# Patient Record
Sex: Female | Born: 1985 | Race: White | Hispanic: No | Marital: Married | Smoking: Never smoker
Health system: Southern US, Community
[De-identification: ages and names within clinical notes are randomized; demographics above are authoritative.]

## PROBLEM LIST (undated history)

## (undated) DIAGNOSIS — I10 Essential (primary) hypertension: Secondary | ICD-10-CM

## (undated) DIAGNOSIS — E119 Type 2 diabetes mellitus without complications: Secondary | ICD-10-CM

## (undated) DIAGNOSIS — J45909 Unspecified asthma, uncomplicated: Secondary | ICD-10-CM

## (undated) HISTORY — DX: Unspecified asthma, uncomplicated: J45.909

---
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
## 2017-05-26 ENCOUNTER — Encounter: Payer: Self-pay | Admitting: Emergency Medicine

## 2017-05-26 ENCOUNTER — Emergency Department
Admission: EM | Admit: 2017-05-26 | Discharge: 2017-05-26 | Disposition: A | Discharge status: Home / Self Care | Payer: Medicaid - Out of State | Attending: Emergency Medicine | Admitting: Emergency Medicine

## 2017-05-26 ENCOUNTER — Emergency Department: Payer: Medicaid - Out of State

## 2017-05-26 DIAGNOSIS — M79671 Pain in right foot: Secondary | ICD-10-CM | POA: Diagnosis present

## 2017-05-26 NOTE — ED Provider Notes (Signed)
[facility] Emergency Department Provider Note  ____________________________________________  Time seen: Approximately 5:20 PM  I have reviewed the triage vital signs and the nursing notes.   HISTORY  Chief Complaint Foot Pain    HPI Emily Blevins is a 31 y.o. female that presents to the emergency department with right foot pain on and off for years that worsened this week. Pain is primarily over the top of her foot in the middle. She states that she has had pain on and off with this foot for several years. Pain is worse with moving her ankle. Patient does not smoke. No recent surgeries. No injury. No alleviating measures have been attempted. She wears old tennis shoes and sandals to work. 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 She came to the emergency room today for work note. No calf pain, numbness, tingling, erythema.   History reviewed. No pertinent past medical history.  There are no active problems to display for this patient.   History reviewed. No pertinent surgical history.  Prior to Admission medications   Not on File    Allergies Penicillins  No family history on file.  Social History Social History  Substance Use Topics  . Smoking status: Never Smoker  . Smokeless tobacco: Never Used  . Alcohol use No     Review of Systems  Constitutional: No fever/chills Cardiovascular: No chest pain. Respiratory: No SOB. Gastrointestinal: No abdominal pain.  No nausea, no vomiting.  Skin: Negative for rash, abrasions, lacerations, ecchymosis. Neurological: Negative for headaches, numbness or tingling   ____________________________________________   PHYSICAL EXAM:  VITAL SIGNS: ED Triage Vitals [05/26/17 1659]  Enc Vitals Group     BP (!) 142/81     Pulse Rate 78     Resp 18     Temp 97.9 F (36.6 C)     Temp Source Oral     SpO2 99 %     Weight 285 lb (129.3 kg)     Height   (1.727 m)     Head Circumference      Peak Flow      Pain Score 3     Pain Loc      Pain Edu?      Excl. in GC?      Constitutional: Alert and oriented. Well appearing and in no acute distress. Eyes: Conjunctivae are normal. PERRL. EOMI. Head: Atraumatic. ENT:      Ears:      Nose: No congestion/rhinnorhea.      Mouth/Throat: Mucous membranes are moist.  Neck: No stridor.  Cardiovascular: Normal rate, regular rhythm.  Good peripheral circulation. Respiratory: Normal respiratory effort without tachypnea or retractions. Lungs CTAB. Good air entry to the bases with no decreased or absent breath sounds. Musculoskeletal: Full range of motion to all extremities. No gross deformities appreciated. No calf tenderness to palpation. Tenderness to palpation over dorsal side of foot over the second and third metatarsal. Minimal swelling over second and third metatarsal on right foot. Minimal tenderness to palpation over lateral side of right ankle. No erythema.  Neurologic:  Normal speech and language. No gross focal neurologic deficits are appreciated.  Skin:  Skin is warm, dry and intact. No rash noted.   ____________________________________________   LABS (all labs ordered are listed, but only abnormal results are displayed)  Labs Reviewed - No data to display ____________________________________________  EKG   ____________________________________________  RADIOLOGY Karsten Ro  Loreta AveWagner, personally viewed and evaluated these images (plain radiographs) as part of my medical decision making, as well as reviewing the written report by the radiologist.  Dg Foot Complete Right  Result Date: 05/26/2017 CLINICAL DATA:  Right foot pain and swelling.  No known injury. EXAM: RIGHT FOOT COMPLETE - 3+ VIEW COMPARISON:  None. FINDINGS: There is no evidence of fracture or dislocation. Plantar calcaneal spur. There is no evidence of arthropathy or other focal bone abnormality. Mild soft tissue edema  versus habitus. IMPRESSION: Plantar calcaneal spur.  No acute osseous abnormality. Soft tissue edema versus habitus. Electronically Signed   By: Rubye OaksMelanie  Ehinger M.D.   On: 05/26/2017 18:44    ____________________________________________    PROCEDURES  Procedure(s) performed:    Procedures    Medications - No data to display   ____________________________________________   INITIAL IMPRESSION / ASSESSMENT AND PLAN / ED COURSE  Pertinent labs & imaging results that were available during my care of the patient were reviewed by me and considered in my medical decision making (see chart for details).  Review of the [place] CSRS was performed in accordance of the N[facility] prior to dispensing any controlled drugs.   Patient presented to the emergency department with evaluation of foot pain. X-ray negative for acute bony abnormality. Pain has been on and off for 1 year and has worsened in the last week. We discussed wearing supportive shoes. Patient is to follow up with podiatry as directed. Patient is given ED precautions to return to the ED for any worsening or new symptoms.     ____________________________________________  FINAL CLINICAL IMPRESSION(S) / ED DIAGNOSES  Final diagnoses:  Foot pain, right      NEW MEDICATIONS STARTED DURING THIS VISIT:  There are no discharge medications for this patient.       This chart was dictated using voice recognition software/Dragon. Despite best efforts to proofread, errors can occur which can change the meaning. Any change was purely unintentional.    Enid DerryWagner, Humza Tallerico, PA-C 05/27/17 1627    Emily FilbertWilliams, Jonathan E, MD 05/29/17 [phone]

## 2017-05-26 NOTE — ED Notes (Signed)
Pt verbalizes understanding of discharge instructions.

## 2017-05-26 NOTE — ED Triage Notes (Signed)
Presents with pain across the top of right foot  Denies any injury min swelling noted

## 2017-07-17 ENCOUNTER — Emergency Department
Admission: EM | Admit: 2017-07-17 | Discharge: 2017-07-17 | Disposition: A | Discharge status: Home / Self Care | Payer: Medicaid - Out of State | Attending: Emergency Medicine | Admitting: Emergency Medicine

## 2017-07-17 DIAGNOSIS — H1033 Unspecified acute conjunctivitis, bilateral: Secondary | ICD-10-CM

## 2017-07-17 MED ORDER — CIPROFLOXACIN HCL 0.3 % OP SOLN
2.0000 [drp] | Freq: Once | OPHTHALMIC | Status: AC
  Administered 2017-07-17: 2 [drp] via OPHTHALMIC
  Filled 2017-07-17: qty 2.5

## 2017-07-17 MED ORDER — CIPROFLOXACIN HCL 0.3 % OP SOLN: 1.0000 [drp] | OPHTHALMIC | 1 refills | Status: AC

## 2017-07-17 NOTE — ED Triage Notes (Signed)
Reports whole family with eye redness and swelling. And she has the same.

## 2017-07-17 NOTE — ED Provider Notes (Signed)
[facility] Emergency Department Provider Note  ____________________________________________  Time seen: Approximately 9:18 PM  I have reviewed the triage vital signs and the nursing notes.   HISTORY  Chief Complaint Eye Problem    HPI Emily Blevins is a 31 y.o. female who presents emergency department complaining of erythema, irritation, or drainage from bilateral eyes.  The patient reports that her son was diagnosed with pinkeye, and subsequently the entire family has developed same.  Patient reports that symptoms started in her left eye and now encompasses both eyes.  No visual changes.  No trauma.  Patient wears glasses but not contacts.  No past medical history on file.  There are no active problems to display for this patient.   No past surgical history on file.  Prior to Admission medications   Medication Sig Start Date End Date Taking? Authorizing Provider  ciprofloxacin (CILOXAN) 0.3 % ophthalmic solution Place 1 drop into both eyes every 2 (two) hours. Administer 1 drop, every 2 hours, while awake, for 2 days. Then 1 drop, every 4 hours, while awake, for the next 5 days. 07/17/17 07/22/17  Cuthriell, Delorise RoyalsJonathan D, PA-C    Allergies Penicillins  No family history on file.  Social History Social History  Substance Use Topics  . Smoking status: Never Smoker  . Smokeless tobacco: Never Used  . Alcohol use No     Review of Systems  Constitutional: No fever/chills Eyes: No visual changes.  Positive for pain, erythema, pustular drainage from both eyes. ENT: No upper respiratory complaints. Cardiovascular: no chest pain. Respiratory: no cough. No SOB. Gastrointestinal: No abdominal pain.  No nausea, no vomiting.  Musculoskeletal: Negative for musculoskeletal pain. Skin: Negative for rash, abrasions, lacerations, ecchymosis. Neurological: Negative for headaches, focal weakness or numbness. 10-point ROS otherwise  negative.  ____________________________________________   PHYSICAL EXAM:  VITAL SIGNS: ED Triage Vitals  Enc Vitals Group     BP 07/17/17 1933 (!) 144/79     Pulse Rate 07/17/17 1933 73     Resp 07/17/17 1933 20     Temp 07/17/17 1933 98.4 F (36.9 C)     Temp Source 07/17/17 1933 Oral     SpO2 07/17/17 1933 96 %     Weight 07/17/17 1934 270 lb (122.5 kg)     Height 07/17/17 1934 5\' 8"  (1.727 m)     Head Circumference --      Peak Flow --      Pain Score --      Pain Loc --      Pain Edu? --      Excl. in GC? --      Constitutional: Alert and oriented. Well appearing and in no acute distress. Eyes: Conjunctivae are erythematous bilaterally.  Mild pustular drainage bilateral lower eyelashes.  Funduscopic exam is unremarkable bilaterally.Marland Kitchen. PERRL. EOMI. Head: Atraumatic. ENT:      Ears:       Nose: No congestion/rhinnorhea.      Mouth/Throat: Mucous membranes are moist.  Neck: No stridor.    Cardiovascular: Normal rate, regular rhythm. Normal S1 and S2.  Good peripheral circulation. Respiratory: Normal respiratory effort without tachypnea or retractions. Lungs CTAB. Good air entry to the bases with no decreased or absent breath sounds. Musculoskeletal: Full range of motion to all extremities. No gross deformities appreciated. Neurologic:  Normal speech and language. No gross focal neurologic deficits are appreciated.  Skin:  Skin is warm, dry and intact. No rash noted. Psychiatric: Mood and affect are  normal. Speech and behavior are normal. Patient exhibits appropriate insight and judgement.   ____________________________________________   LABS (all labs ordered are listed, but only abnormal results are displayed)  Labs Reviewed - No data to display ____________________________________________  EKG   ____________________________________________  RADIOLOGY   No results found.  ____________________________________________    PROCEDURES  Procedure(s)  performed:    Procedures    Medications  ciprofloxacin (CILOXAN) 0.3 % ophthalmic solution 2 drop (not administered)     ____________________________________________   INITIAL IMPRESSION / ASSESSMENT AND PLAN / ED COURSE  Pertinent labs & imaging results that were available during my care of the patient were reviewed by me and considered in my medical decision making (see chart for details).  Review of the [place] CSRS was performed in accordance of the N[facility] prior to dispensing any controlled drugs.     Patient's diagnosis is consistent with bacterial conjunctivitis.  Patient has been exposed to her son who had bacterial conjunctivitis, now the entire family has symptoms.. Patient will be discharged home with prescriptions for Cipro eyedrops. Patient is to follow up with ophthalmologist as needed or otherwise directed. Patient is given ED precautions to return to the ED for any worsening or new symptoms.     ____________________________________________  FINAL CLINICAL IMPRESSION(S) / ED DIAGNOSES  Final diagnoses:  Acute bacterial conjunctivitis of both eyes      NEW MEDICATIONS STARTED DURING THIS VISIT:  New Prescriptions   CIPROFLOXACIN (CILOXAN) 0.3 % OPHTHALMIC SOLUTION    Place 1 drop into both eyes every 2 (two) hours. Administer 1 drop, every 2 hours, while awake, for 2 days. Then 1 drop, every 4 hours, while awake, for the next 5 days.        This chart was dictated using voice recognition software/Dragon. Despite best efforts to proofread, errors can occur which can change the meaning. Any change was purely unintentional.    Racheal Patches, PA-C 07/17/17 2125    Sharman Cheek, MD 07/17/17 2342

## 2017-11-04 ENCOUNTER — Emergency Department
Admission: EM | Admit: 2017-11-04 | Discharge: 2017-11-04 | Disposition: A | Discharge status: Home / Self Care | Payer: Self-pay | Attending: Student in an Organized Health Care Education/Training Program | Admitting: Student in an Organized Health Care Education/Training Program

## 2017-11-04 ENCOUNTER — Other Ambulatory Visit: Payer: Self-pay

## 2017-11-04 ENCOUNTER — Emergency Department: Payer: Self-pay

## 2017-11-04 ENCOUNTER — Encounter: Payer: Self-pay | Admitting: Emergency Medicine

## 2017-11-04 DIAGNOSIS — M654 Radial styloid tenosynovitis [de Quervain]: Secondary | ICD-10-CM | POA: Insufficient documentation

## 2017-11-04 MED ORDER — MELOXICAM 15 MG PO TABS: 15.0000 mg | ORAL_TABLET | Freq: Every day | ORAL | 1 refills | Status: AC

## 2017-11-04 NOTE — ED Provider Notes (Signed)
[facility] Emergency Department Provider Note  ____________________________________________  Time seen: Approximately 4:52 PM  I have reviewed the triage vital signs and the nursing notes.   HISTORY  Chief Complaint Wrist Pain    HPI Emily Blevins is a 32 y.o. female presents to the emergency department with pain along the first dorsal extensor compartment of the left wrist worsened with ulnar deviation at the left wrist.  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  She denies weakness but has tingling in the pads of all 5 fingers.  No alleviating measures have been attempted.  She currently rates her pain 8 out of 10 in intensity.   History reviewed. No pertinent past medical history.  There are no active problems to display for this patient.   History reviewed. No pertinent surgical history.  Prior to Admission medications   Medication Sig Start Date End Date Taking? Authorizing Provider  meloxicam (MOBIC) 15 MG tablet Take 1 tablet (15 mg total) by mouth daily for 7 days. 11/04/17 11/11/17  Orvil FeilWoods, Elzina Devera M, PA-C    Allergies Penicillins  No family history on file.  Social History Social History   Tobacco Use  . Smoking status: Never Smoker  . Smokeless tobacco: Never Used  Substance Use Topics  . Alcohol use: No  . Drug use: Not on file     Review of Systems  Constitutional: No fever/chills Eyes: No visual changes. No discharge ENT: No upper respiratory complaints. Cardiovascular: no chest pain. Respiratory: no cough. No SOB. Gastrointestinal: No abdominal pain.  No nausea, no vomiting.  No diarrhea.  No constipation. Genitourinary: Negative for dysuria. No hematuria Musculoskeletal: Patient has left wrist pain.  Skin: Negative for rash, abrasions, lacerations, ecchymosis. Neurological: Negative for headaches, focal weakness or  numbness.   ____________________________________________   PHYSICAL EXAM:  VITAL SIGNS: ED Triage Vitals  Enc Vitals Group     BP 11/04/17 1433 136/77     Pulse Rate 11/04/17 1433 75     Resp 11/04/17 1433 20     Temp 11/04/17 1433 98.1 F (36.7 C)     Temp Source 11/04/17 1433 Oral     SpO2 11/04/17 1433 98 %     Weight 11/04/17 1434 300 lb (136.1 kg)     Height 11/04/17 1434 5\' 8"  (1.727 m)     Head Circumference --      Peak Flow --      Pain Score 11/04/17 1434 8     Pain Loc --      Pain Edu? --      Excl. in GC? --      Constitutional: Alert and oriented. Well appearing and in no acute distress. Eyes: Conjunctivae are normal. PERRL. EOMI. Head: Atraumatic. Cardiovascular: Normal rate, regular rhythm. Normal S1 and S2.  Good peripheral circulation. Respiratory: Normal respiratory effort without tachypnea or retractions. Lungs CTAB. Good air entry to the bases with no decreased or absent breath sounds. Musculoskeletal: Patient can perform limited range of motion at the left wrist, likely secondary to pain.  Patient localizes pain to the first dorsal extensor compartment of the left wrist.  Patient's pain is reproduced with Lourena SimmondsFinkelstein test.  Palpable radial pulse, left. Neurologic:  Normal speech and language. No gross focal neurologic deficits are appreciated.  Skin:  Skin is warm, dry and intact. No rash noted. Psychiatric: Mood and affect are normal. Speech and behavior are normal. Patient exhibits appropriate insight and judgement.  ____________________________________________   LABS (all labs ordered are listed, but only abnormal results are displayed)  Labs Reviewed - No data to display ____________________________________________  EKG   ____________________________________________  RADIOLOGY Geraldo Pitter, personally viewed and evaluated these images (plain radiographs) as part of my medical decision making, as well as reviewing the written report  by the radiologist.  Dg Wrist Complete Left  Result Date: 11/04/2017 CLINICAL DATA:  Wrist pain after injury. EXAM: LEFT WRIST - COMPLETE 3+ VIEW COMPARISON:  None. FINDINGS: There is no evidence of fracture or dislocation. There is no evidence of arthropathy or other focal bone abnormality. Soft tissues are unremarkable. IMPRESSION: Negative. Electronically Signed   By: Kennith Center M.D.   On: 11/04/2017 16:14    ____________________________________________    PROCEDURES  Procedure(s) performed:    Procedures    Medications - No data to display   ____________________________________________   INITIAL IMPRESSION / ASSESSMENT AND PLAN / ED COURSE  Pertinent labs & imaging results that were available during my care of the patient were reviewed by me and considered in my medical decision making (see chart for details).  Review of the [place] CSRS was performed in accordance of the N[facility] prior to dispensing any controlled drugs.     Assessment and plan De Quervain's tenosynovitis Patient presents to the emergency department with left wrist pain localized to the first dorsal extensor compartment and reproduced with Lourena Simmonds test.  Differential diagnosis originally included ligamentous sprain, carpal tunnel and fracture.  X-ray examination of the left wrist was reassuring.  Patient was treated empirically for de Quervain's with a wrist splint and meloxicam.  Patient was advised to follow-up with orthopedics if pain persists.  All patient questions were answered.     ____________________________________________  FINAL CLINICAL IMPRESSION(S) / ED DIAGNOSES  Final diagnoses:  De Quervain's tenosynovitis      NEW MEDICATIONS STARTED DURING THIS VISIT:  ED Discharge Orders        Ordered    meloxicam (MOBIC) 15 MG tablet  Daily     11/04/17 1639          This chart was dictated using voice recognition software/Dragon. Despite best efforts to proofread, errors can  occur which can change the meaning. Any change was purely unintentional.    Orvil Feil, PA-C 11/04/17 1656    Willy Eddy, MD 11/04/17 [phone]

## 2017-11-04 NOTE — ED Notes (Signed)

## 2017-11-04 NOTE — ED Triage Notes (Signed)
L wrist pain since yesterday. States was doing a lot of pulling at work.

## 2017-11-04 NOTE — ED Notes (Addendum)
Pt states she is having numbness and tingling from finger tips up past wrist. Pt states " it feels like ants crawling around in there" pain increases when pressure applied to any part of the hand. Cap refill < 3 sec and circulation/pulse are WNL

## 2018-04-25 ENCOUNTER — Emergency Department
Admission: EM | Admit: 2018-04-25 | Discharge: 2018-04-25 | Disposition: A | Discharge status: Home / Self Care | Payer: Self-pay | Attending: Emergency Medicine | Admitting: Emergency Medicine

## 2018-04-25 ENCOUNTER — Other Ambulatory Visit: Payer: Self-pay

## 2018-04-25 ENCOUNTER — Encounter: Payer: Self-pay | Admitting: Emergency Medicine

## 2018-04-25 DIAGNOSIS — J029 Acute pharyngitis, unspecified: Secondary | ICD-10-CM

## 2018-04-25 LAB — GROUP A STREP BY PCR: Group A Strep by PCR: NOT DETECTED

## 2018-04-25 MED ORDER — CETIRIZINE HCL 10 MG PO TABS: 10.0000 mg | ORAL_TABLET | Freq: Every day | ORAL | 0 refills | Status: DC

## 2018-04-25 MED ORDER — DEXAMETHASONE SODIUM PHOSPHATE 10 MG/ML IJ SOLN
10.0000 mg | Freq: Once | INTRAMUSCULAR | Status: AC
  Administered 2018-04-25: 10 mg via INTRAMUSCULAR
  Filled 2018-04-25: qty 1

## 2018-04-25 NOTE — ED Triage Notes (Signed)
Patient ambulatory to triage with steady gait, without difficulty or distress noted; pt reports recent scratchy throat, now painful; unsure if allergies or illness

## 2018-04-25 NOTE — ED Notes (Signed)
Pt complains of sinus congestion and drainage over the last couple weeks with throat pain increasing today. Pt complains of difficulty and pain swallowing.

## 2018-04-25 NOTE — ED Provider Notes (Signed)
[facility] Emergency Department Provider Note  ____________________________________________  Time seen: Approximately 9:26 PM  I have reviewed the triage vital signs and the nursing notes.   HISTORY  Chief Complaint Sore Throat    HPI Emily Blevins is a 32 y.o. female presents to the emergency department with rhinorrhea, congestion, nonproductive cough and bilateral conjunctivitis for approximately 5 days.  Patient reports that pharyngitis progressively worsened today and she reports pain with eating.  Patient is managing her own secretions and tolerating fluids.  She denies fever or chills.  Patient has tried Tylenol but no other alleviating measures.   History reviewed. No pertinent past medical history.  There are no active problems to display for this patient.   History reviewed. No pertinent surgical history.  Prior to Admission medications   Medication Sig Start Date End Date Taking? Authorizing Provider  cetirizine (ZYRTEC ALLERGY) 10 MG tablet Take 1 tablet (10 mg total) by mouth daily for 7 days. 04/25/18 05/02/18  Orvil FeilWoods, Jaclyn M, PA-C    Allergies Penicillins  No family history on file.  Social History Social History   Tobacco Use  . Smoking status: Never Smoker  . Smokeless tobacco: Never Used  Substance Use Topics  . Alcohol use: No  . Drug use: Not on file      Review of Systems  Constitutional: Patient has been afebrile.  Eyes: No visual changes. No discharge ENT: Patient has congestion.  Cardiovascular: no chest pain. Respiratory: Patient has cough.  Gastrointestinal: No abdominal pain.  No nausea, no vomiting. Patient had diarrhea.  Genitourinary: Negative for dysuria. No hematuria Musculoskeletal: Patient has myalgias.  Skin: Negative for rash, abrasions, lacerations, ecchymosis. Neurological: No headache, no focal weakness or numbness.     ____________________________________________   PHYSICAL  EXAM:  VITAL SIGNS: ED Triage Vitals  Enc Vitals Group     BP 04/25/18 2005 (!) 144/90     Pulse Rate 04/25/18 2005 92     Resp 04/25/18 2005 18     Temp 04/25/18 2005 98.3 F (36.8 C)     Temp Source 04/25/18 2005 Oral     SpO2 04/25/18 2005 100 %     Weight 04/25/18 1958 274 lb (124.3 kg)     Height 04/25/18 1958 5\' 8"  (1.727 m)     Head Circumference --      Peak Flow --      Pain Score 04/25/18 1959 10     Pain Loc --      Pain Edu? --      Excl. in GC? --      Constitutional: Alert and oriented. Well appearing and in no acute distress. Eyes: Conjunctivae are normal. PERRL. EOMI. Head: Atraumatic. ENT:      Ears: TMs are effused bilaterally.      Nose: No congestion/rhinnorhea.      Mouth/Throat: Mucous membranes are moist.  Posterior pharynx is mildly erythematous with no tonsillar exudate.  Tonsils appear symmetric and uvula is midline. Neck: No stridor.  No cervical spine tenderness to palpation. Cardiovascular: Normal rate, regular rhythm. Normal S1 and S2.  Good peripheral circulation. Respiratory: Normal respiratory effort without tachypnea or retractions. Lungs CTAB. Good air entry to the bases with no decreased or absent breath sounds. Gastrointestinal: Bowel sounds 4 quadrants. Soft and nontender to palpation. No guarding or rigidity. No palpable masses. No distention. No CVA tenderness. Musculoskeletal: Full range of motion to all extremities. No gross deformities appreciated. Neurologic:  Normal speech and language.  No gross focal neurologic deficits are appreciated.  Skin:  Skin is warm, dry and intact. No rash noted.   ____________________________________________   LABS (all labs ordered are listed, but only abnormal results are displayed)  Labs Reviewed  GROUP A STREP BY PCR   ____________________________________________  EKG   ____________________________________________  RADIOLOGY   No results  found.  ____________________________________________    PROCEDURES  Procedure(s) performed:    Procedures    Medications  dexamethasone (DECADRON) injection 10 mg (has no administration in time range)     ____________________________________________   INITIAL IMPRESSION / ASSESSMENT AND PLAN / ED COURSE  Pertinent labs & imaging results that were available during my care of the patient were reviewed by me and considered in my medical decision making (see chart for details).  Review of the [place] CSRS was performed in accordance of the N[facility] prior to dispensing any controlled drugs.      Assessment and plan Viral pharyngitis Patient presents to the emergency department with pharyngitis.  Associated symptoms include congestion, rhinorrhea and bilateral conjunctivitis.  History and physical exam findings are consistent with viral  pharyngitis.  Patient was given Decadron in the emergency department.  She was discharged with Zyrtec.  Vital signs are reassuring prior to discharge.  All patient questions were answered.   ____________________________________________  FINAL CLINICAL IMPRESSION(S) / ED DIAGNOSES  Final diagnoses:  Viral pharyngitis      NEW MEDICATIONS STARTED DURING THIS VISIT:  ED Discharge Orders        Ordered    cetirizine (ZYRTEC ALLERGY) 10 MG tablet  Daily     04/25/18 2115          This chart was dictated using voice recognition software/Dragon. Despite best efforts to proofread, errors can occur which can change the meaning. Any change was purely unintentional.    Orvil Feil, PA-C 04/25/18 2134    Minna Antis, MD 04/25/18 2251

## 2018-10-22 ENCOUNTER — Encounter: Payer: Self-pay | Admitting: Emergency Medicine

## 2018-10-22 ENCOUNTER — Emergency Department
Admission: EM | Admit: 2018-10-22 | Discharge: 2018-10-22 | Disposition: A | Discharge status: Home / Self Care | Payer: 59 | Attending: Emergency Medicine | Admitting: Emergency Medicine

## 2018-10-22 DIAGNOSIS — K529 Noninfective gastroenteritis and colitis, unspecified: Secondary | ICD-10-CM | POA: Diagnosis not present

## 2018-10-22 DIAGNOSIS — R112 Nausea with vomiting, unspecified: Secondary | ICD-10-CM | POA: Diagnosis present

## 2018-10-22 LAB — URINALYSIS, COMPLETE (UACMP) WITH MICROSCOPIC
BILIRUBIN URINE: NEGATIVE
Bacteria, UA: NONE SEEN
GLUCOSE, UA: NEGATIVE mg/dL
HGB URINE DIPSTICK: NEGATIVE
Ketones, ur: NEGATIVE mg/dL
LEUKOCYTES UA: NEGATIVE
NITRITE: NEGATIVE
PROTEIN: NEGATIVE mg/dL
Specific Gravity, Urine: 1.026 (ref 1.005–1.030)
pH: 6 (ref 5.0–8.0)

## 2018-10-22 LAB — CBC
HCT: 41.5 % (ref 36.0–46.0)
HEMOGLOBIN: 13.7 g/dL (ref 12.0–15.0)
MCH: 26.7 pg (ref 26.0–34.0)
MCHC: 33 g/dL (ref 30.0–36.0)
MCV: 80.7 fL (ref 80.0–100.0)
NRBC: 0 % (ref 0.0–0.2)
Platelets: 242 10*3/uL (ref 150–400)
RBC: 5.14 MIL/uL — ABNORMAL HIGH (ref 3.87–5.11)
RDW: 14.6 % (ref 11.5–15.5)
WBC: 13.8 10*3/uL — AB (ref 4.0–10.5)

## 2018-10-22 LAB — COMPREHENSIVE METABOLIC PANEL
ALT: 26 U/L (ref 0–44)
AST: 20 U/L (ref 15–41)
Albumin: 4.1 g/dL (ref 3.5–5.0)
Alkaline Phosphatase: 47 U/L (ref 38–126)
Anion gap: 8 (ref 5–15)
BILIRUBIN TOTAL: 0.9 mg/dL (ref 0.3–1.2)
BUN: 17 mg/dL (ref 6–20)
CO2: 23 mmol/L (ref 22–32)
Calcium: 8.9 mg/dL (ref 8.9–10.3)
Chloride: 106 mmol/L (ref 98–111)
Creatinine, Ser: 0.68 mg/dL (ref 0.44–1.00)
Glucose, Bld: 107 mg/dL — ABNORMAL HIGH (ref 70–99)
POTASSIUM: 3.6 mmol/L (ref 3.5–5.1)
Sodium: 137 mmol/L (ref 135–145)
TOTAL PROTEIN: 7.2 g/dL (ref 6.5–8.1)

## 2018-10-22 LAB — POCT PREGNANCY, URINE: Preg Test, Ur: NEGATIVE

## 2018-10-22 LAB — LIPASE, BLOOD: LIPASE: 24 U/L (ref 11–51)

## 2018-10-22 MED ORDER — SODIUM CHLORIDE 0.9% FLUSH: 3.0000 mL | Freq: Once | INTRAVENOUS | Status: DC

## 2018-10-22 NOTE — Discharge Instructions (Signed)
Please seek medical attention for any high fevers, chest pain, shortness of breath, change in behavior, persistent vomiting, bloody stool or any other new or concerning symptoms.  

## 2018-10-22 NOTE — ED Triage Notes (Signed)
Pt c/o N/V/D since this AM but has since been able to keep down chicken broth but is unable to return to work without doctors note. Pt has 2 children at home that had same symptoms over the weekend.

## 2018-10-22 NOTE — ED Provider Notes (Signed)
[facility] Emergency Department Provider Note   ____________________________________________   I have reviewed the triage vital signs and the nursing notes.   HISTORY  Chief Complaint I am here for a work note  History limited by: Not Limited   HPI Emily Blevins is a 33 y.o. female who presents to the emergency department today to obtain a work note.  She states she was told by her job she needed work-up before she could go back to work.  The patient called out from work today because of concerns for nausea vomiting diarrhea.  The symptoms did start today.  The patient states that her last episode of vomiting was roughly 4 PM today.  She did have family member with similar symptoms.  History reviewed. No pertinent past medical history.  There are no active problems to display for this patient.   History reviewed. No pertinent surgical history.  Prior to Admission medications   Medication Sig Start Date End Date Taking? Authorizing Provider  cetirizine (ZYRTEC ALLERGY) 10 MG tablet Take 1 tablet (10 mg total) by mouth daily for 7 days. 04/25/18 05/02/18  Orvil FeilWoods, Jaclyn M, PA-C    Allergies Penicillins  History reviewed. No pertinent family history.  Social History Social History   Tobacco Use  . Smoking status: Never Smoker  . Smokeless tobacco: Never Used  Substance Use Topics  . Alcohol use: No  . Drug use: Not on file    Review of Systems Constitutional: No fever/chills Eyes: No visual changes. ENT: No sore throat. Cardiovascular: Denies chest pain. Respiratory: Denies shortness of breath. Gastrointestinal: Positive for abdominal pain, nausea vomiting and diarrhea Genitourinary: Negative for dysuria. Musculoskeletal: Negative for back pain. Skin: Negative for rash. Neurological: Negative for headaches, focal weakness or numbness.  ____________________________________________   PHYSICAL EXAM:  VITAL SIGNS: ED Triage Vitals  [10/22/18 2019]  Enc Vitals Group     BP 133/85     Pulse Rate 80     Resp 18     Temp 98.3 F (36.8 C)     Temp Source Oral     SpO2 99 %     Weight 290 lb (131.5 kg)     Height 5\' 8"  (1.727 m)   Constitutional: Alert and oriented.  Eyes: Conjunctivae are normal.  ENT      Head: Normocephalic and atraumatic.      Nose: No congestion/rhinnorhea.      Mouth/Throat: Mucous membranes are moist.      Neck: No stridor. Hematological/Lymphatic/Immunilogical: No cervical lymphadenopathy. Cardiovascular: Normal rate, regular rhythm.  No murmurs, rubs, or gallops.  Respiratory: Normal respiratory effort without tachypnea nor retractions. Breath sounds are clear and equal bilaterally. No wheezes/rales/rhonchi. Gastrointestinal: Soft and non tender. No rebound. No guarding.  Genitourinary: Deferred Musculoskeletal: Normal range of motion in all extremities. No lower extremity edema. Neurologic:  Normal speech and language. No gross focal neurologic deficits are appreciated.  Skin:  Skin is warm, dry and intact. No rash noted. Psychiatric: Mood and affect are normal. Speech and behavior are normal. Patient exhibits appropriate insight and judgment.  ____________________________________________    LABS (pertinent positives/negatives)  Upreg negative Lipase 24 CMP wnl except glu 107 CBC wbc 13.8, hgb 13.7, plt 242 UA clear, unremarkable  ____________________________________________   EKG  None  ____________________________________________    RADIOLOGY  None  ____________________________________________   PROCEDURES  Procedures  ____________________________________________   INITIAL IMPRESSION / ASSESSMENT AND PLAN / ED COURSE  Pertinent labs & imaging results that  were available during my care of the patient were reviewed by me and considered in my medical decision making (see chart for details).   Presented to the emergency department today to obtain a work  note.  She called out from work today because of concerns for nausea vomiting diarrhea.  The patient will be given a work note. ____________________________________________   FINAL CLINICAL IMPRESSION(S) / ED DIAGNOSES  Final diagnoses:  Gastroenteritis     Note: This dictation was prepared with Dragon dictation. Any transcriptional errors that result from this process are unintentional     Phineas Semen, MD 10/22/18 2330

## 2019-11-28 IMAGING — DX DG WRIST COMPLETE 3+V*L*
4 series · 4 of 4 positions shown · non-contrast
Comparison: None.

CLINICAL DATA: Wrist pain after injury.

EXAM:
LEFT WRIST - COMPLETE 3+ VIEW

[wrist ap (1 of 2)]
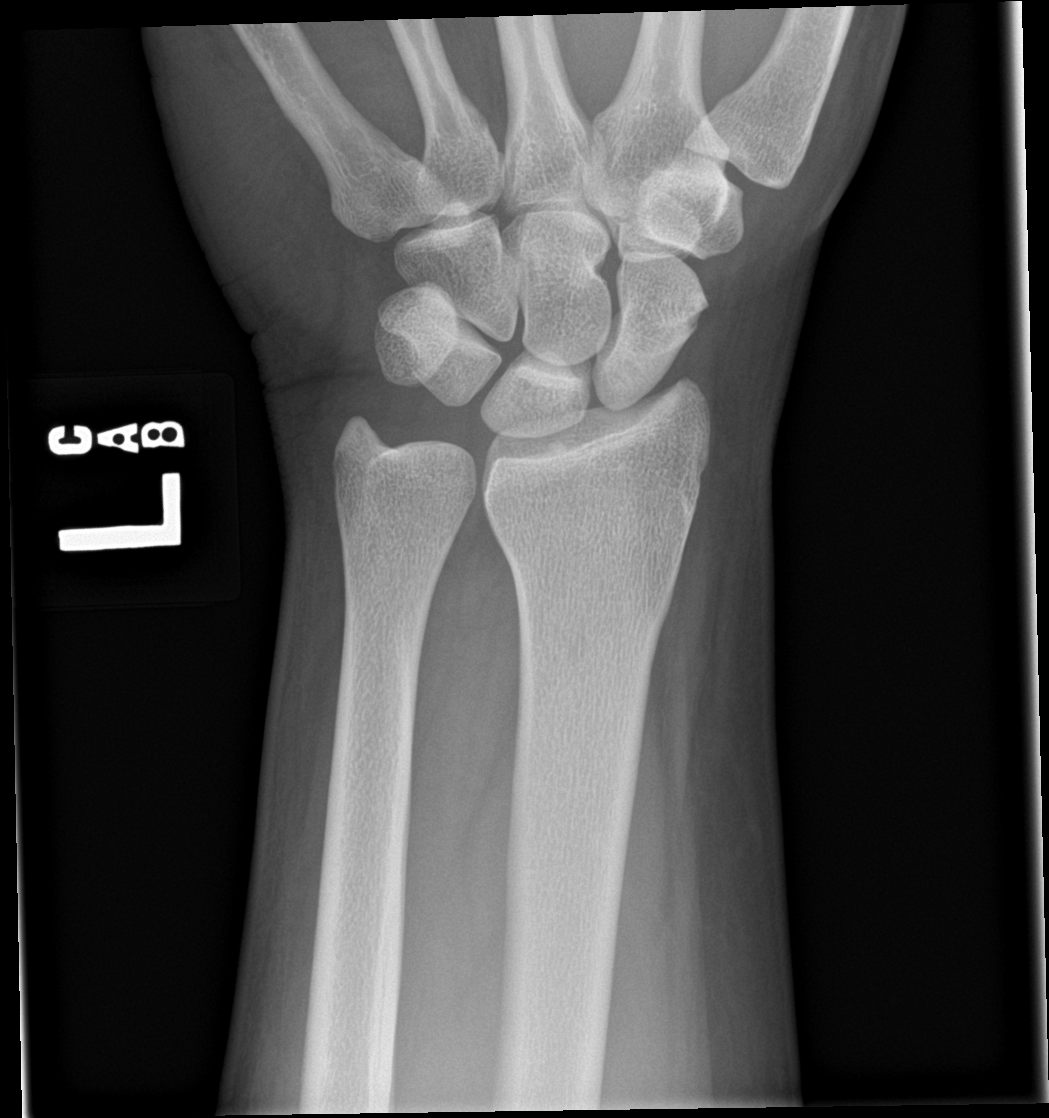

[wrist obl]
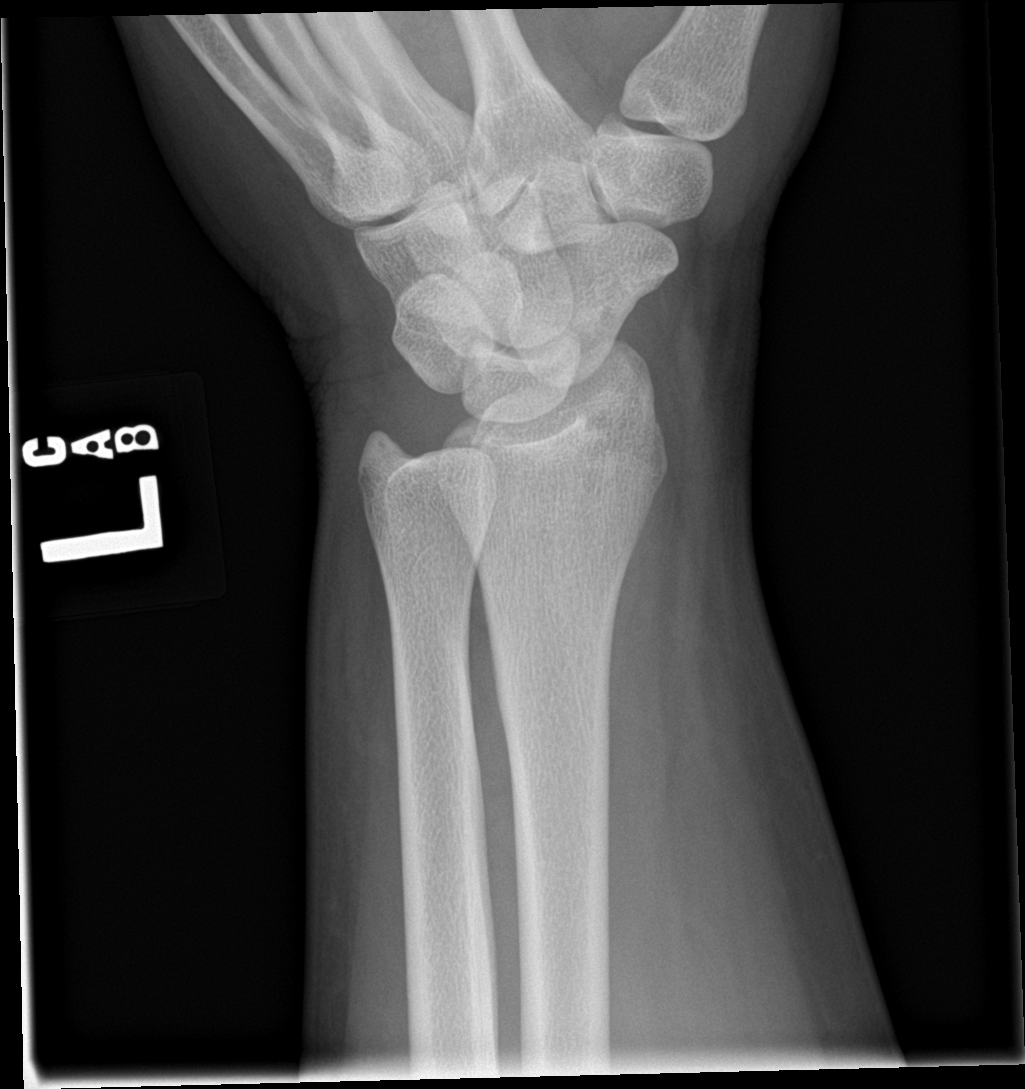

[wrist lat]
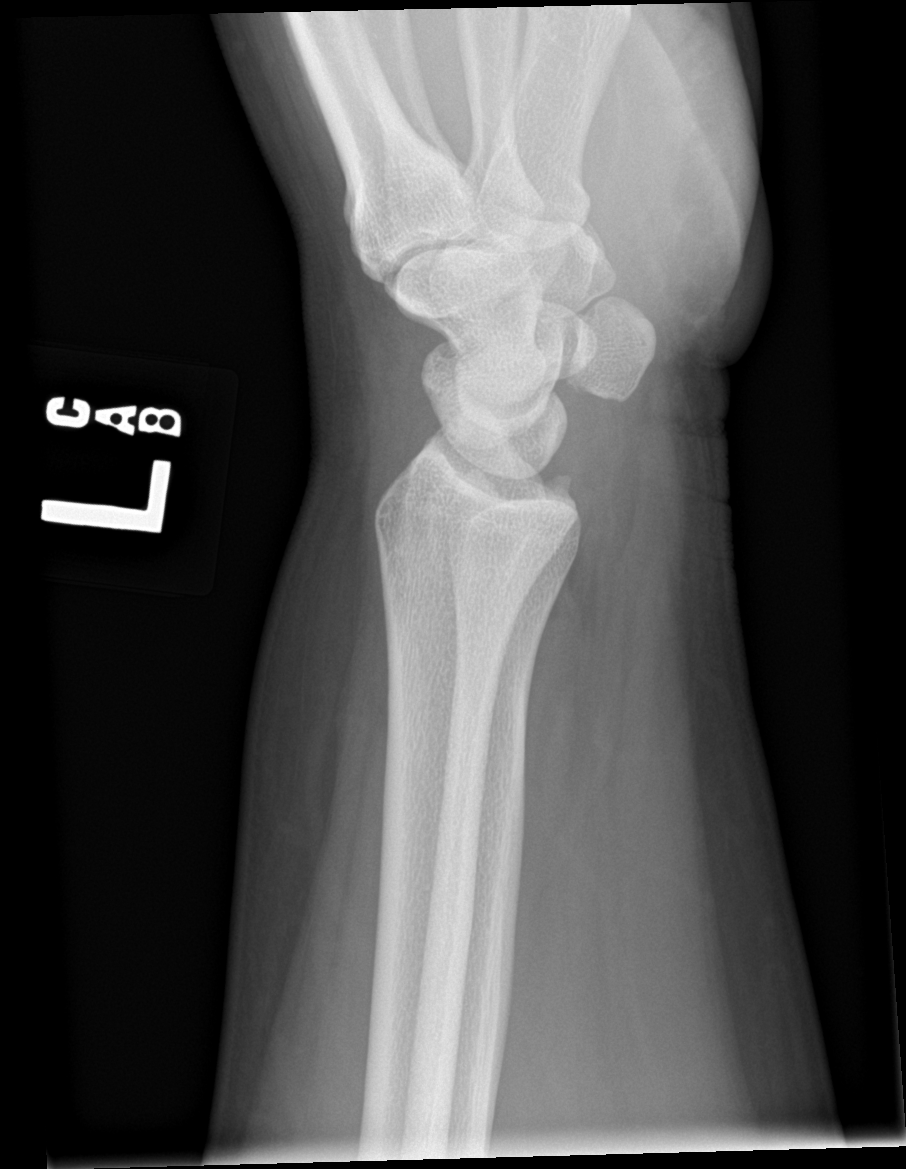

[wrist ap (2 of 2)]
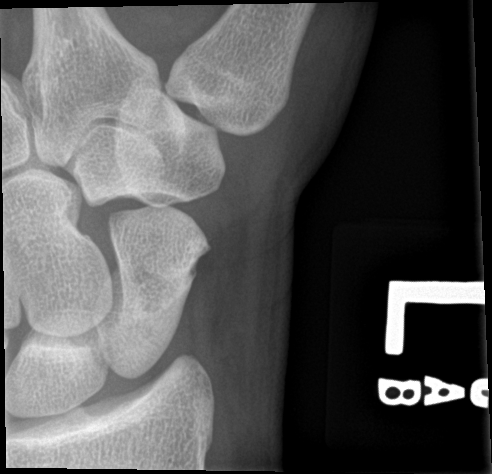

[4 of 4 positions shown; findings below may reference images not displayed]

FINDINGS: There is no evidence of fracture or dislocation. There is no
evidence of arthropathy or other focal bone abnormality. Soft
tissues are unremarkable.
IMPRESSION: Negative.

## 2020-08-31 ENCOUNTER — Emergency Department
Admission: EM | Admit: 2020-08-31 | Discharge: 2020-08-31 | Disposition: A | Discharge status: Home / Self Care | Payer: 59 | Attending: Emergency Medicine | Admitting: Emergency Medicine

## 2020-08-31 ENCOUNTER — Other Ambulatory Visit: Payer: Self-pay

## 2020-08-31 ENCOUNTER — Ambulatory Visit: Payer: Self-pay | Admitting: *Deleted

## 2020-08-31 DIAGNOSIS — I10 Essential (primary) hypertension: Secondary | ICD-10-CM | POA: Insufficient documentation

## 2020-08-31 DIAGNOSIS — E1165 Type 2 diabetes mellitus with hyperglycemia: Secondary | ICD-10-CM | POA: Insufficient documentation

## 2020-08-31 DIAGNOSIS — E860 Dehydration: Secondary | ICD-10-CM | POA: Insufficient documentation

## 2020-08-31 DIAGNOSIS — R739 Hyperglycemia, unspecified: Secondary | ICD-10-CM

## 2020-08-31 HISTORY — DX: Type 2 diabetes mellitus without complications: E11.9

## 2020-08-31 HISTORY — DX: Essential (primary) hypertension: I10

## 2020-08-31 LAB — BASIC METABOLIC PANEL
Anion gap: 11 (ref 5–15)
BUN: 15 mg/dL (ref 6–20)
CO2: 23 mmol/L (ref 22–32)
Calcium: 9.6 mg/dL (ref 8.9–10.3)
Chloride: 100 mmol/L (ref 98–111)
Creatinine, Ser: 0.86 mg/dL (ref 0.44–1.00)
GFR, Estimated: >60 mL/min (ref >60)
Glucose, Bld: 348 mg/dL — ABNORMAL HIGH (ref 70–99)
Potassium: 4.2 mmol/L (ref 3.5–5.1)
Sodium: 134 mmol/L — ABNORMAL LOW (ref 135–145)

## 2020-08-31 LAB — URINALYSIS, COMPLETE (UACMP) WITH MICROSCOPIC
Bilirubin Urine: NEGATIVE
Glucose, UA: >=500 mg/dL — AB
Hgb urine dipstick: NEGATIVE
Ketones, ur: NEGATIVE mg/dL
Leukocytes,Ua: NEGATIVE
Nitrite: NEGATIVE
Protein, ur: NEGATIVE mg/dL
Specific Gravity, Urine: 1.027 (ref 1.005–1.030)
pH: 6 (ref 5.0–8.0)

## 2020-08-31 LAB — CBC
HCT: 39.4 % (ref 36.0–46.0)
Hemoglobin: 13.5 g/dL (ref 12.0–15.0)
MCH: 27.2 pg (ref 26.0–34.0)
MCHC: 34.3 g/dL (ref 30.0–36.0)
MCV: 79.4 fL — ABNORMAL LOW (ref 80.0–100.0)
Platelets: 240 10*3/uL (ref 150–400)
RBC: 4.96 MIL/uL (ref 3.87–5.11)
RDW: 13.5 % (ref 11.5–15.5)
WBC: 11.6 10*3/uL — ABNORMAL HIGH (ref 4.0–10.5)
nRBC: 0 % (ref 0.0–0.2)

## 2020-08-31 LAB — POC URINE PREG, ED: Preg Test, Ur: NEGATIVE

## 2020-08-31 LAB — CBG MONITORING, ED
Glucose-Capillary: 325 mg/dL — ABNORMAL HIGH (ref 70–99)
Glucose-Capillary: 198 mg/dL — ABNORMAL HIGH (ref 70–99)

## 2020-08-31 MED ORDER — INSULIN ASPART 100 UNIT/ML ~~LOC~~ SOLN
7.0000 [IU] | Freq: Once | SUBCUTANEOUS | Status: AC
  Administered 2020-08-31: 7 [IU] via SUBCUTANEOUS
  Filled 2020-08-31: qty 1

## 2020-08-31 MED ORDER — SODIUM CHLORIDE 0.9 % IV BOLUS
1000.0000 mL | Freq: Once | INTRAVENOUS | Status: AC
  Administered 2020-08-31: 1000 mL via INTRAVENOUS

## 2020-08-31 NOTE — Discharge Instructions (Addendum)
For your blood sugars:  Take the metformin as prescribed. This will NOT directly lower your blood sugar, so taking more will only cause stomach upset but will not help your sugars.  Eat more frequent, smaller meals.  Drink at least 8 glasses of watery daily  Call your primary to discuss your ER visit. You may ultimately need additional medication for your blood sugars

## 2020-08-31 NOTE — ED Notes (Signed)
Lights dimmed, pt connected to monitor, warm blanket given   lw edt

## 2020-08-31 NOTE — ED Provider Notes (Signed)
[facility] Emergency Department Provider Note  ____________________________________________   Event Date/Time   First MD Initiated Contact with Patient 08/31/20 [phone]     (approximate)  I have reviewed the triage vital signs and the nursing notes.   HISTORY  Chief Complaint Hyperglycemia    HPI Emily Blevins is a 34 y.o. female  With h/o HTN, DM, here with hyperglycemia. Pt reports that despite starting metformin, her blood glucose has been steadily increasing. She reports that over the past 2 weeks or so, she's had progressively worsening blood sugars with numbers as high as 500s. She's felt mild fatigue, dry mouth but no polyuria. No SOB. She does not recall any specific changes in her health recently. In particular, denies any fever, chills, or recent illnesses. No chest pain. No major diet or weight changes. She states that she began taking metformin yesterday more than prescribed for her BG, which has not helped. Has taken up to 3000 mg of metformin. Reports mild abd pain but no n/v/d.        Past Medical History:  Diagnosis Date  . Diabetes mellitus without complication (HCC)   . Hypertension     There are no problems to display for this patient.   Past Surgical History:  Procedure Laterality Date  . CESAREAN SECTION     x2    Prior to Admission medications   Medication Sig Start Date End Date Taking? Authorizing Provider  cetirizine (ZYRTEC ALLERGY) 10 MG tablet Take 1 tablet (10 mg total) by mouth daily for 7 days. 04/25/18 05/02/18  Orvil Feil, PA-C    Allergies Penicillins  No family history on file.  Social History Social History   Tobacco Use  . Smoking status: Never Smoker  . Smokeless tobacco: Never Used  Substance Use Topics  . Alcohol use: No  . Drug use: Not Currently    Review of Systems  Review of Systems  Constitutional: Positive for fatigue. Negative for fever.  HENT: Negative for congestion and sore  throat.   Eyes: Negative for visual disturbance.  Respiratory: Negative for cough and shortness of breath.   Cardiovascular: Negative for chest pain.  Gastrointestinal: Negative for abdominal pain, diarrhea, nausea and vomiting.  Endocrine: Positive for polydipsia.  Genitourinary: Negative for flank pain.  Musculoskeletal: Negative for back pain and neck pain.  Skin: Negative for rash and wound.  Neurological: Negative for weakness.  All other systems reviewed and are negative.    ____________________________________________  PHYSICAL EXAM:      VITAL SIGNS: ED Triage Vitals  Enc Vitals Group     BP 08/31/20 0839 (!) 150/106     Pulse Rate 08/31/20 0839 85     Resp 08/31/20 0839 16     Temp 08/31/20 0839 98.7 F (37.1 C)     Temp Source 08/31/20 0839 Oral     SpO2 08/31/20 0839 100 %     Weight 08/31/20 0840 298 lb (135.2 kg)     Height 08/31/20 0840 5\' 8"  (1.727 m)     Head Circumference --      Peak Flow --      Pain Score 08/31/20 0840 7     Pain Loc --      Pain Edu? --      Excl. in GC? --      Physical Exam Vitals and nursing note reviewed.  Constitutional:      General: She is not in acute distress.    Appearance:  She is well-developed.  HENT:     Head: Normocephalic and atraumatic.     Mouth/Throat:     Mouth: Mucous membranes are dry.  Eyes:     Conjunctiva/sclera: Conjunctivae normal.  Cardiovascular:     Rate and Rhythm: Normal rate and regular rhythm.     Heart sounds: Normal heart sounds. No murmur heard. No friction rub.  Pulmonary:     Effort: Pulmonary effort is normal. No respiratory distress.     Breath sounds: Normal breath sounds. No wheezing or rales.  Abdominal:     General: There is no distension.     Palpations: Abdomen is soft.     Tenderness: There is no abdominal tenderness.  Musculoskeletal:     Cervical back: Neck supple.  Skin:    General: Skin is warm.     Capillary Refill: Capillary refill takes less than 2 seconds.   Neurological:     Mental Status: She is alert and oriented to person, place, and time.     Motor: No abnormal muscle tone.       ____________________________________________   LABS (all labs ordered are listed, but only abnormal results are displayed)  Labs Reviewed  BASIC METABOLIC PANEL - Abnormal; Notable for the following components:      Result Value   Sodium 134 (*)    Glucose, Bld 348 (*)    All other components within normal limits  CBC - Abnormal; Notable for the following components:   WBC 11.6 (*)    MCV 79.4 (*)    All other components within normal limits  URINALYSIS, COMPLETE (UACMP) WITH MICROSCOPIC - Abnormal; Notable for the following components:   Color, Urine YELLOW (*)    APPearance HAZY (*)    Glucose, UA >=500 (*)    Bacteria, UA RARE (*)    All other components within normal limits  CBG MONITORING, ED - Abnormal; Notable for the following components:   Glucose-Capillary 325 (*)    All other components within normal limits  CBG MONITORING, ED - Abnormal; Notable for the following components:   Glucose-Capillary 198 (*)    All other components within normal limits  LACTIC ACID, PLASMA  LACTIC ACID, PLASMA  POC URINE PREG, ED  CBG MONITORING, ED  CBG MONITORING, ED    ____________________________________________  EKG:  ________________________________________  RADIOLOGY All imaging, including plain films, CT scans, and ultrasounds, independently reviewed by me, and interpretations confirmed via formal radiology reads.  ED MD interpretation:     Official radiology report(s): No results found.  ____________________________________________  PROCEDURES   Procedure(s) performed (including Critical Care):  .1-3 Lead EKG Interpretation Performed by: Shaune Pollack, MD Authorized by: Shaune Pollack, MD     Interpretation: normal     ECG rate:  60-90   ECG rate assessment: normal     Rhythm: sinus rhythm     Ectopy: none      Conduction: normal   Comments:     Indication: hyperglycemia, receiving fluids and insulin    ____________________________________________  INITIAL IMPRESSION / MDM / ASSESSMENT AND PLAN / ED COURSE  As part of my medical decision making, I reviewed the following data within the electronic MEDICAL RECORD NUMBER Nursing notes reviewed and incorporated, Old chart reviewed, Notes from prior ED visits, and [place] Controlled Substance Database       *Emily Blevins was evaluated in Emergency Department on 08/31/2020 for the symptoms described in the history of present illness. She was evaluated in the context  of the global COVID-19 pandemic, which necessitated consideration that the patient might be at risk for infection with the SARS-CoV-2 virus that causes COVID-19. Institutional protocols and algorithms that pertain to the evaluation of patients at risk for COVID-19 are in a state of rapid change based on information released by regulatory bodies including the CDC and federal and state organizations. These policies and algorithms were followed during the patient's care in the ED.  Some ED evaluations and interventions may be delayed as a result of limited staffing during the pandemic.*     Medical Decision Making:  34 yo F here with hyperglycemia. Pt also has mild abd discomfort, which I suspect is 2/2 taking 3000 mg of metformin. Pt has no acidosis on BMP, normal AG, no signs to suggest DKA or lactic acidosis related to her metformin use. Suspect worsening T2DM with insulin resistance. Pt given fluids, insulin here with improvement. No apparent infectious, ischemic, or other trigger. Will advise her to call her PCP, continue low carb diet, hydrate, and continue prescribed dose of metformin.  ____________________________________________  FINAL CLINICAL IMPRESSION(S) / ED DIAGNOSES  Final diagnoses:  Hyperglycemia  Dehydration     MEDICATIONS GIVEN DURING THIS VISIT:  Medications  sodium  chloride 0.9 % bolus 1,000 mL (0 mLs Intravenous Stopped 08/31/20 1309)  insulin aspart (novoLOG) injection 7 Units (7 Units Subcutaneous Given 08/31/20 5732)     ED Discharge Orders    None       Note:  This document was prepared using Dragon voice recognition software and may include unintentional dictation errors.   Shaune Pollack, MD 08/31/20 [phone]

## 2020-08-31 NOTE — ED Triage Notes (Addendum)
Pt states she took 6, 500mg  metformin over the past 8 hours attempting to get her sugar down, states it is still over 300.pt is in NAD. Pt does c/o some abd discomfort.

## 2020-08-31 NOTE — Telephone Encounter (Signed)
  Pt called in on Community line calling for [facility].   She was transferred to [facility].  She does not have a doctor due to a change in company and insurance.  Her glucose has been elevated.  This morning it's 390.   Last weekend it was as high as 500.    See note below.  I referred her to the ED per the protocol and because she does not have a doctor.   She has taken 8 Metformin tablets in the last 24 hours trying to get her sugar down herself.   I instructed her not to take any more metformin until seen in the ED.  C/o stomach pains also.  She was agreeable to going to the ED and is going to [facility] now.  Reason for Disposition . Patient sounds very sick or weak to the triager    Does not have a PCP.  Answer Assessment - Initial Assessment Questions 1. BLOOD GLUCOSE: "What is your blood glucose level?"      Blood glucose 390 this morning.   I switched doctors so I don't have a doctor. 2. ONSET: "When did you check the blood glucose?"     This morning. 3. USUAL RANGE: "What is your glucose level usually?" (e.g., usual fasting morning value, usual evening value)     Last weekend it was 500. I'm having anxiety too.   I took 2 Metformin last night, 2 more at [facility] and this morning 2 more metformin.   My stomach is hurting.  I have a headache. 4. KETONES: "Do you check for ketones (urine or blood test strips)?" If yes, ask: "What does the test show now?"      No 5. TYPE 1 or 2:  "Do you know what type of diabetes you have?"  (e.g., Type 1, Type 2, Gestational; doesn't know)      Type II  6. INSULIN: "Do you take insulin?" "What type of insulin(s) do you use? What is the mode of delivery? (syringe, pen; injection or pump)?"      No  Metformin only 7. DIABETES PILLS: "Do you take any pills for your diabetes?" If yes, ask: "Have you missed taking any pills recently?"     Metformin 8. OTHER SYMPTOMS: "Do you have any symptoms?" (e.g., fever, frequent  urination, difficulty breathing, dizziness, weakness, vomiting)     Stomach is hurting 9. PREGNANCY: "Is there any chance you are pregnant?" "When was your last menstrual period?"     Not asked  Protocols used: DIABETES - HIGH BLOOD SUGAR-A-AH

## 2020-11-11 ENCOUNTER — Other Ambulatory Visit: Payer: Self-pay

## 2020-11-11 ENCOUNTER — Emergency Department
Admission: EM | Admit: 2020-11-11 | Discharge: 2020-11-11 | Disposition: A | Discharge status: Home / Self Care | Payer: No Typology Code available for payment source | Attending: Emergency Medicine | Admitting: Emergency Medicine

## 2020-11-11 ENCOUNTER — Emergency Department: Payer: No Typology Code available for payment source

## 2020-11-11 DIAGNOSIS — Z7984 Long term (current) use of oral hypoglycemic drugs: Secondary | ICD-10-CM | POA: Insufficient documentation

## 2020-11-11 DIAGNOSIS — Z79899 Other long term (current) drug therapy: Secondary | ICD-10-CM | POA: Insufficient documentation

## 2020-11-11 DIAGNOSIS — M7918 Myalgia, other site: Secondary | ICD-10-CM

## 2020-11-11 DIAGNOSIS — M791 Myalgia, unspecified site: Secondary | ICD-10-CM | POA: Insufficient documentation

## 2020-11-11 DIAGNOSIS — Y92410 Unspecified street and highway as the place of occurrence of the external cause: Secondary | ICD-10-CM | POA: Insufficient documentation

## 2020-11-11 DIAGNOSIS — I10 Essential (primary) hypertension: Secondary | ICD-10-CM | POA: Insufficient documentation

## 2020-11-11 DIAGNOSIS — F419 Anxiety disorder, unspecified: Secondary | ICD-10-CM | POA: Insufficient documentation

## 2020-11-11 DIAGNOSIS — R0789 Other chest pain: Secondary | ICD-10-CM | POA: Insufficient documentation

## 2020-11-11 DIAGNOSIS — E119 Type 2 diabetes mellitus without complications: Secondary | ICD-10-CM | POA: Insufficient documentation

## 2020-11-11 MED ORDER — ORPHENADRINE CITRATE ER 100 MG PO TB12: 100.0000 mg | ORAL_TABLET | Freq: Two times a day (BID) | ORAL | 0 refills | Status: DC

## 2020-11-11 MED ORDER — NAPROXEN 500 MG PO TABS: 500.0000 mg | ORAL_TABLET | Freq: Two times a day (BID) | ORAL | Status: DC

## 2020-11-11 MED ORDER — HYDROXYZINE HCL 50 MG PO TABS
50.0000 mg | ORAL_TABLET | Freq: Once | ORAL | Status: AC
  Administered 2020-11-11: 50 mg via ORAL
  Filled 2020-11-11: qty 1
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
## 2020-11-11 NOTE — Discharge Instructions (Signed)
No acute findings on chest x-ray.  Follow discharge care instruction take medication as directed.
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
## 2020-12-09 ENCOUNTER — Emergency Department: Payer: Self-pay

## 2020-12-09 ENCOUNTER — Emergency Department
Admission: EM | Admit: 2020-12-09 | Discharge: 2020-12-09 | Disposition: A | Discharge status: Home / Self Care | Payer: Self-pay | Attending: Emergency Medicine | Admitting: Emergency Medicine

## 2020-12-09 ENCOUNTER — Other Ambulatory Visit: Payer: Self-pay

## 2020-12-09 DIAGNOSIS — S6991XA Unspecified injury of right wrist, hand and finger(s), initial encounter: Secondary | ICD-10-CM | POA: Insufficient documentation

## 2020-12-09 DIAGNOSIS — S4991XA Unspecified injury of right shoulder and upper arm, initial encounter: Secondary | ICD-10-CM | POA: Diagnosis not present

## 2020-12-09 DIAGNOSIS — W19XXXA Unspecified fall, initial encounter: Secondary | ICD-10-CM

## 2020-12-09 DIAGNOSIS — W010XXA Fall on same level from slipping, tripping and stumbling without subsequent striking against object, initial encounter: Secondary | ICD-10-CM | POA: Diagnosis not present

## 2020-12-09 DIAGNOSIS — Z7984 Long term (current) use of oral hypoglycemic drugs: Secondary | ICD-10-CM | POA: Insufficient documentation

## 2020-12-09 DIAGNOSIS — E119 Type 2 diabetes mellitus without complications: Secondary | ICD-10-CM | POA: Insufficient documentation

## 2020-12-09 DIAGNOSIS — I10 Essential (primary) hypertension: Secondary | ICD-10-CM | POA: Diagnosis not present

## 2020-12-09 MED ORDER — LIDOCAINE 5 % EX PTCH
1.0000 | MEDICATED_PATCH | CUTANEOUS | Status: DC
  Administered 2020-12-09: 1 via TRANSDERMAL
  Filled 2020-12-09: qty 1

## 2020-12-09 NOTE — Discharge Instructions (Addendum)
No acute findings x-ray of the right upper extremity.  Read and follow discharge care instructions.  Wear arm sling for 2 to 3 days.  Wear Lidoderm patch on the upper arm for 12 hours.  Follow-up with scheduled physical therapy.
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
## 2020-12-09 NOTE — ED Notes (Signed)
See triage note  Presents s/p fall  States she fell backwards down her steps  Having pain to right shoulder and hand  States she grabbed hold of rail during the fall  No deformity but has had injury and currently having PT to same arm  Good pulses

## 2020-12-09 NOTE — ED Triage Notes (Signed)
Pt fell yesterday and landed on right arm and hand. C/o pain there with some lower back pain.

## 2021-03-17 ENCOUNTER — Emergency Department
Admission: EM | Admit: 2021-03-17 | Discharge: 2021-03-17 | Disposition: A | Discharge status: Home / Self Care | Payer: Medicaid Other | Attending: Emergency Medicine | Admitting: Emergency Medicine

## 2021-03-17 ENCOUNTER — Other Ambulatory Visit: Payer: Self-pay

## 2021-03-17 DIAGNOSIS — E119 Type 2 diabetes mellitus without complications: Secondary | ICD-10-CM | POA: Insufficient documentation

## 2021-03-17 DIAGNOSIS — L0291 Cutaneous abscess, unspecified: Secondary | ICD-10-CM

## 2021-03-17 DIAGNOSIS — I10 Essential (primary) hypertension: Secondary | ICD-10-CM | POA: Insufficient documentation

## 2021-03-17 DIAGNOSIS — L02416 Cutaneous abscess of left lower limb: Secondary | ICD-10-CM | POA: Insufficient documentation

## 2021-03-17 DIAGNOSIS — Z7984 Long term (current) use of oral hypoglycemic drugs: Secondary | ICD-10-CM | POA: Insufficient documentation

## 2021-03-17 MED ORDER — LIDOCAINE HCL (PF) 1 % IJ SOLN
5.0000 mL | Freq: Once | INTRAMUSCULAR | Status: AC
  Administered 2021-03-17: 5 mL via INTRADERMAL
  Filled 2021-03-17: qty 5

## 2021-03-17 MED ORDER — CLINDAMYCIN HCL 150 MG PO CAPS: 300.0000 mg | ORAL_CAPSULE | Freq: Three times a day (TID) | ORAL | 0 refills | Status: DC

## 2021-03-17 NOTE — ED Notes (Signed)
ED Provider at bedside. 

## 2021-03-17 NOTE — Discharge Instructions (Addendum)
Follow-up with your regular doctor if not improving in 2 to 3 days.  Return emergency department if worsening.  Take the antibiotic as prescribed.  Your wound will heal from the bottom up.  He can wash it very gently with soap and water.

## 2021-03-17 NOTE — ED Triage Notes (Signed)
Pt to ED for abscess to left hip x a week and half. Redness noted to area.  Pt reports pain Denies feverse NAD noted

## 2021-03-17 NOTE — ED Provider Notes (Signed)
[facility] Emergency Department Provider Note  ____________________________________________   Event Date/Time   First MD Initiated Contact with Patient 03/17/21 1604     (approximate)  I have reviewed the triage vital signs and the nursing notes.   HISTORY  Chief Complaint Abscess    HPI Emily Blevins is a 35 y.o. female presents emergency department complaining of a abscess to the left hip.  Patient states that she thought a bug had bitten her on the left hip and began to scratch the area.  Now the area has become very red and swollen.  Warm to the touch.  She denies fever or chills.  States that area has made it very difficult to climb onto the machinery at work.  Things told her she would see her doctor today.  Past Medical History:  Diagnosis Date   Diabetes mellitus without complication (HCC)    Hypertension     There are no problems to display for this patient.   Past Surgical History:  Procedure Laterality Date   CESAREAN SECTION     x2    Prior to Admission medications   Medication Sig Start Date End Date Taking? Authorizing Provider  clindamycin (CLEOCIN) 150 MG capsule Take 2 capsules (300 mg total) by mouth 3 (three) times daily. 03/17/21  Yes Shawndell Varas, Roselyn Bering, PA-C  cetirizine (ZYRTEC ALLERGY) 10 MG tablet Take 1 tablet (10 mg total) by mouth daily for 7 days. 04/25/18 05/02/18  Orvil Feil, PA-C  metFORMIN (GLUCOPHAGE) 500 MG tablet Take 1,000 mg by mouth 2 (two) times daily. 06/15/20   [provider]  naproxen (NAPROSYN) 500 MG tablet Take 1 tablet (500 mg total) by mouth 2 (two) times daily with a meal. 11/11/20   Joni Reining, PA-C  orphenadrine (NORFLEX) 100 MG tablet Take 1 tablet (100 mg total) by mouth 2 (two) times daily. 11/11/20   Joni Reining, PA-C  sertraline (ZOLOFT) 50 MG tablet Take 50 mg by mouth 2 (two) times daily. 06/04/20   [provider]    Allergies Penicillins  No family history  on file.  Social History Social History   Tobacco Use   Smoking status: Never   Smokeless tobacco: Never  Substance Use Topics   Alcohol use: No   Drug use: Not Currently    Review of Systems  Constitutional: No fever/chills Eyes: No visual changes. ENT: No sore throat. Respiratory: Denies cough Cardiovascular: Denies chest pain Gastrointestinal: Denies abdominal pain Genitourinary: Negative for dysuria. Musculoskeletal: Negative for back pain.  Positive for abscess on the left upper leg Skin: Negative for rash. Psychiatric: no mood changes,     ____________________________________________   PHYSICAL EXAM:  VITAL SIGNS: ED Triage Vitals  Enc Vitals Group     BP 03/17/21 1459 139/90     Pulse Rate 03/17/21 1459 94     Resp 03/17/21 1459 18     Temp 03/17/21 1459 98.7 F (37.1 C)     Temp Source 03/17/21 1459 Oral     SpO2 03/17/21 1459 95 %     Weight 03/17/21 1459 290 lb (131.5 kg)     Height 03/17/21 1459 5\' 8"  (1.727 m)     Head Circumference --      Peak Flow --      Pain Score 03/17/21 1501 10     Pain Loc --      Pain Edu? --      Excl. in GC? --  Constitutional: Alert and oriented. Well appearing and in no acute distress. Eyes: Conjunctivae are normal.  Head: Atraumatic. Nose: No congestion/rhinnorhea. Mouth/Throat: Mucous membranes are moist.   Neck:  supple no lymphadenopathy noted Cardiovascular: Normal rate, regular rhythm.  Respiratory: Normal respiratory effort.  No retractions, GU: deferred Musculoskeletal: FROM all extremities, warm and well perfused, left upper outer hip has a red area in size of a $0.50 piece.  Area is tender to palpation.  Swelling is noted surrounding that area. Neurologic:  Normal speech and language.  Skin:  Skin is warm, dry and intact. No rash noted. Psychiatric: Mood and affect are normal. Speech and behavior are normal.  ____________________________________________   LABS (all labs ordered are listed,  but only abnormal results are displayed)  Labs Reviewed - No data to display ____________________________________________   ____________________________________________  RADIOLOGY    ____________________________________________   PROCEDURES  Procedure(s) performed:   Marland KitchenMarland KitchenIncision and Drainage  Date/Time: 03/17/2021 6:19 PM Performed by: Faythe Ghee, PA-C Authorized by: Faythe Ghee, PA-C   Consent:    Consent obtained:  Verbal   Consent given by:  Patient   Risks, benefits, and alternatives were discussed: yes     Risks discussed:  Bleeding, incomplete drainage, pain, damage to other organs and infection   Alternatives discussed:  No treatment Universal protocol:    Procedure explained and questions answered to patient or proxy's satisfaction: yes     Patient identity confirmed:  Verbally with patient Location:    Type:  Abscess   Location:  Lower extremity   Lower extremity location:  Hip   Hip location:  L hip Pre-procedure details:    Skin preparation:  Povidone-iodine Anesthesia:    Anesthesia method:  Local infiltration   Local anesthetic:  Lidocaine 1% w/o epi Procedure type:    Complexity:  Simple Procedure details:    Ultrasound guidance: no     Needle aspiration: no     Incision types:  Stab incision   Incision depth:  Dermal   Drainage:  Bloody and purulent   Drainage amount:  Scant   Wound treatment:  Wound left open Post-procedure details:    Procedure completion:  Tolerated well, no immediate complications    ____________________________________________   INITIAL IMPRESSION / ASSESSMENT AND PLAN / ED COURSE  Pertinent labs & imaging results that were available during my care of the patient were reviewed by me and considered in my medical decision making (see chart for details).   Conditions 35 year old female presents for abscess to left hip.  See HPI.  Physical exam shows patient per stable.  See procedure note for incision and  drainage.  Patient tolerated procedure well.  She was given a prescription for an antibiotic.  She is to follow-up with her regular doctor if not improving in 2 to 3 days.  Return emergency department worsening.  Apply warm compresses to the area.  Clean area gently with soap and water.  She was discharged stable condition.     Emily Blevins was evaluated in Emergency Department on 03/17/2021 for the symptoms described in the history of present illness. She was evaluated in the context of the global COVID-19 pandemic, which necessitated consideration that the patient might be at risk for infection with the SARS-CoV-2 virus that causes COVID-19. Institutional protocols and algorithms that pertain to the evaluation of patients at risk for COVID-19 are in a state of rapid change based on information released by regulatory bodies including the CDC and federal and  state organizations. These policies and algorithms were followed during the patient's care in the ED.    As part of my medical decision making, I reviewed the following data within the electronic MEDICAL RECORD NUMBER Nursing notes reviewed and incorporated, Old chart reviewed, Notes from prior ED visits, and [place] Controlled Substance Database  ____________________________________________   FINAL CLINICAL IMPRESSION(S) / ED DIAGNOSES  Final diagnoses:  Abscess      NEW MEDICATIONS STARTED DURING THIS VISIT:  Discharge Medication List as of 03/17/2021  5:16 PM     START taking these medications   Details  clindamycin (CLEOCIN) 150 MG capsule Take 2 capsules (300 mg total) by mouth 3 (three) times daily., Starting Wed 03/17/2021, Normal         Note:  This document was prepared using Dragon voice recognition software and may include unintentional dictation errors.    Faythe Ghee, PA-C 03/17/21 1821    Shaune Pollack, MD 03/17/21 2249

## 2021-08-23 ENCOUNTER — Other Ambulatory Visit: Payer: Self-pay

## 2021-08-23 ENCOUNTER — Emergency Department
Admission: EM | Admit: 2021-08-23 | Discharge: 2021-08-23 | Disposition: A | Discharge status: Home / Self Care | Payer: BC Managed Care – PPO | Attending: Emergency Medicine | Admitting: Emergency Medicine

## 2021-08-23 ENCOUNTER — Encounter: Payer: Self-pay | Admitting: Emergency Medicine

## 2021-08-23 DIAGNOSIS — E119 Type 2 diabetes mellitus without complications: Secondary | ICD-10-CM | POA: Diagnosis not present

## 2021-08-23 DIAGNOSIS — L03012 Cellulitis of left finger: Secondary | ICD-10-CM | POA: Insufficient documentation

## 2021-08-23 DIAGNOSIS — Z7984 Long term (current) use of oral hypoglycemic drugs: Secondary | ICD-10-CM | POA: Diagnosis not present

## 2021-08-23 DIAGNOSIS — I10 Essential (primary) hypertension: Secondary | ICD-10-CM | POA: Insufficient documentation

## 2021-08-23 MED ORDER — SULFAMETHOXAZOLE-TRIMETHOPRIM 800-160 MG PO TABS
1.0000 | ORAL_TABLET | Freq: Once | ORAL | Status: AC
  Administered 2021-08-23: 1 via ORAL
  Filled 2021-08-23: qty 1

## 2021-08-23 MED ORDER — LIDOCAINE HCL (PF) 1 % IJ SOLN
10.0000 mL | Freq: Once | INTRAMUSCULAR | Status: AC
  Administered 2021-08-23: 10 mL
  Filled 2021-08-23: qty 10

## 2021-08-23 MED ORDER — SULFAMETHOXAZOLE-TRIMETHOPRIM 800-160 MG PO TABS: 1.0000 | ORAL_TABLET | Freq: Two times a day (BID) | ORAL | 0 refills | Status: DC

## 2021-08-23 NOTE — ED Provider Notes (Signed)
Emergency Medicine Provider Triage Evaluation Note  Emily Blevins , a 35 y.o. female  was evaluated in triage.  Pt complains of middle finger infection to left hand.  Patient believes she has a finger infection.  Patient was told that she needed drainage and attempted to drain it with a needle.  Pain is along the medial aspect of the nailbed.  No drainage at this time.  No other complaints..  Review of Systems  Positive: Possible fingertip infection Negative: Fevers, chills, loss of range of motion to the finger  Physical Exam  There were no vitals taken for this visit. Gen:   Awake, no distress   Resp:  Normal effort  MSK:   Moves extremities without difficulty.  No finger with paronychia on the medial nailbed Other:    Medical Decision Making  Medically screening exam initiated at 6:43 PM.  Appropriate orders placed.  Emily Blevins was informed that the remainder of the evaluation will be completed by another provider, this initial triage assessment does not replace that evaluation, and the importance of remaining in the ED until their evaluation is complete.  Patient has findings consistent with paronychia.  Recommend this be drained, placed on antibiotics   Racheal Patches, PA-C 08/23/21 1845    Minna Antis, MD 08/23/21 2000

## 2021-08-23 NOTE — ED Triage Notes (Signed)
Pt via POV from home. Pt c/o R and L middle finger swelling and pain that started couple of days ago. Pt is A&OX4 and NAD>

## 2021-08-23 NOTE — ED Provider Notes (Signed)
[facility] Emergency Department Provider Note  ____________________________________________  Time seen: Approximately 7:36 PM  I have reviewed the triage vital signs and the nursing notes.   HISTORY  Chief Complaint Finger Injury    HPI Emily Blevins is a 35 y.o. female patient presented to the ED with possible fingertip infection.  Patient had been seen by myself in triage prior to being roomed.  Patient has a paronychia to the left middle finger.  This will need drainage, antibiotics.  Patient has no loss of range of motion.  No evidence on physical exam of infectious tenosynovitis..  No evidence of felon's       Past Medical History:  Diagnosis Date   Diabetes mellitus without complication (HCC)    Hypertension     There are no problems to display for this patient.   Past Surgical History:  Procedure Laterality Date   CESAREAN SECTION     x2    Prior to Admission medications   Medication Sig Start Date End Date Taking? Authorizing Provider  sulfamethoxazole-trimethoprim (BACTRIM DS) 800-160 MG tablet Take 1 tablet by mouth 2 (two) times daily. 08/23/21  Yes Derrill Bagnell, Delorise Royals, PA-C  cetirizine (ZYRTEC ALLERGY) 10 MG tablet Take 1 tablet (10 mg total) by mouth daily for 7 days. 04/25/18 05/02/18  Orvil Feil, PA-C  clindamycin (CLEOCIN) 150 MG capsule Take 2 capsules (300 mg total) by mouth 3 (three) times daily. 03/17/21   Fisher, Roselyn Bering, PA-C  metFORMIN (GLUCOPHAGE) 500 MG tablet Take 1,000 mg by mouth 2 (two) times daily. 06/15/20   [provider]  naproxen (NAPROSYN) 500 MG tablet Take 1 tablet (500 mg total) by mouth 2 (two) times daily with a meal. 11/11/20   Joni Reining, PA-C  orphenadrine (NORFLEX) 100 MG tablet Take 1 tablet (100 mg total) by mouth 2 (two) times daily. 11/11/20   Joni Reining, PA-C  sertraline (ZOLOFT) 50 MG tablet Take 50 mg by mouth 2 (two) times daily. 06/04/20   [provider]     Allergies Penicillins  History reviewed. No pertinent family history.  Social History Social History   Tobacco Use   Smoking status: Never   Smokeless tobacco: Never  Substance Use Topics   Alcohol use: No   Drug use: Not Currently     Review of Systems  Constitutional: No fever/chills Eyes: No visual changes. No discharge ENT: No upper respiratory complaints. Cardiovascular: no chest pain. Respiratory: no cough. No SOB. Gastrointestinal: No abdominal pain.  No nausea, no vomiting.  No diarrhea.  No constipation. Musculoskeletal: Paronychia to the left middle finger Skin: Negative for rash, abrasions, lacerations, ecchymosis. Neurological: Negative for headaches, focal weakness or numbness.  10 System ROS otherwise negative.  ____________________________________________   PHYSICAL EXAM:  VITAL SIGNS: ED Triage Vitals  Enc Vitals Group     BP 08/23/21 1846 (!) 148/97     Pulse Rate 08/23/21 1846 93     Resp 08/23/21 1846 20     Temp 08/23/21 1846 98.4 F (36.9 C)     Temp src --      SpO2 08/23/21 1846 95 %     Weight 08/23/21 1844 290 lb (131.5 kg)     Height 08/23/21 1844 5\' 8"  (1.727 m)     Head Circumference --      Peak Flow --      Pain Score 08/23/21 1844 10     Pain Loc --  Pain Edu? --      Excl. in GC? --      Constitutional: Alert and oriented. Well appearing and in no acute distress. Eyes: Conjunctivae are normal. PERRL. EOMI. Head: Atraumatic. ENT:      Ears:       Nose: No congestion/rhinnorhea.      Mouth/Throat: Mucous membranes are moist.  Neck: No stridor.    Cardiovascular: Normal rate, regular rhythm. Normal S1 and S2.  Good peripheral circulation. Respiratory: Normal respiratory effort without tachypnea or retractions. Lungs CTAB. Good air entry to the bases with no decreased or absent breath sounds. Musculoskeletal: Full range of motion to all extremities. No gross deformities appreciated.  Visualization of the left  middle finger reveals what appears to be a paronychia along the medial aspect of the finger.  Full range of motion to the finger.  Sensation capillary refill intact.  No evidence of infectious tenosynovitis.  No evidence of felon's Neurologic:  Normal speech and language. No gross focal neurologic deficits are appreciated.  Skin:  Skin is warm, dry and intact. No rash noted. Psychiatric: Mood and affect are normal. Speech and behavior are normal. Patient exhibits appropriate insight and judgement.   ____________________________________________   LABS (all labs ordered are listed, but only abnormal results are displayed)  Labs Reviewed - No data to display ____________________________________________  EKG   ____________________________________________  RADIOLOGY   No results found.  ____________________________________________    PROCEDURES  Procedure(s) performed:    Marland KitchenMarland KitchenIncision and Drainage  Date/Time: 08/23/2021 8:11 PM Performed by: Racheal Patches, PA-C Authorized by: Racheal Patches, PA-C   Consent:    Consent obtained:  Verbal   Consent given by:  Patient   Risks discussed:  Bleeding, incomplete drainage and pain Universal protocol:    Procedure explained and questions answered to patient or proxy's satisfaction: yes     Immediately prior to procedure, a time out was called: yes     Patient identity confirmed:  Verbally with patient Location:    Indications for incision and drainage: Paronychia.   Location:  Upper extremity   Upper extremity location:  Finger   Finger location:  L long finger Pre-procedure details:    Skin preparation:  Antiseptic wash Anesthesia:    Anesthesia method:  Nerve block   Block location:  Middle finger L hand   Block needle gauge:  27 G   Block anesthetic:  Lidocaine 1% w/o epi   Block technique:  Digital block    Medications  lidocaine (PF) (XYLOCAINE) 1 % injection 10 mL (10 mLs Infiltration Given 08/23/21  2003)  sulfamethoxazole-trimethoprim (BACTRIM DS) 800-160 MG per tablet 1 tablet (1 tablet Oral Given 08/23/21 2048)     ____________________________________________   INITIAL IMPRESSION / ASSESSMENT AND PLAN / ED COURSE  Pertinent labs & imaging results that were available during my care of the patient were reviewed by me and considered in my medical decision making (see chart for details).  Review of the [place] CSRS was performed in accordance of the [facility] prior to dispensing any controlled drugs.           Patient's diagnosis is consistent with paronychia.  Patient had a paronychia and this was drained at bedside as described above.  Patient tolerated well.  Antibiotics for ongoing infection management.  Patient has return precautions if needed.  Follow-up primary care as needed.. Patient is given ED precautions to return to the ED for any worsening or new symptoms.  ____________________________________________  FINAL CLINICAL IMPRESSION(S) / ED DIAGNOSES  Final diagnoses:  Paronychia of finger of left hand      NEW MEDICATIONS STARTED DURING THIS VISIT:  ED Discharge Orders          Ordered    sulfamethoxazole-trimethoprim (BACTRIM DS) 800-160 MG tablet  2 times daily        08/23/21 2028                This chart was dictated using voice recognition software/Dragon. Despite best efforts to proofread, errors can occur which can change the meaning. Any change was purely unintentional.    Racheal Patches, PA-C 08/23/21 2326    Minna Antis, MD 08/24/21 2235

## 2021-08-23 NOTE — ED Notes (Signed)
Patient declined discharge vital signs. 

## 2021-12-07 ENCOUNTER — Ambulatory Visit: Payer: BC Managed Care – PPO | Admitting: Internal Medicine

## 2022-08-07 ENCOUNTER — Emergency Department
Admission: EM | Admit: 2022-08-07 | Discharge: 2022-08-07 | Disposition: A | Discharge status: Home / Self Care | Payer: Self-pay | Attending: Emergency Medicine | Admitting: Emergency Medicine

## 2022-08-07 ENCOUNTER — Other Ambulatory Visit: Payer: Self-pay

## 2022-08-07 ENCOUNTER — Emergency Department: Payer: PRIVATE HEALTH INSURANCE

## 2022-08-07 DIAGNOSIS — R202 Paresthesia of skin: Secondary | ICD-10-CM | POA: Insufficient documentation

## 2022-08-07 DIAGNOSIS — R000 Tachycardia, unspecified: Secondary | ICD-10-CM | POA: Insufficient documentation

## 2022-08-07 DIAGNOSIS — R5383 Other fatigue: Secondary | ICD-10-CM | POA: Insufficient documentation

## 2022-08-07 DIAGNOSIS — F419 Anxiety disorder, unspecified: Secondary | ICD-10-CM | POA: Insufficient documentation

## 2022-08-07 DIAGNOSIS — R519 Headache, unspecified: Secondary | ICD-10-CM | POA: Insufficient documentation

## 2022-08-07 DIAGNOSIS — R739 Hyperglycemia, unspecified: Secondary | ICD-10-CM

## 2022-08-07 DIAGNOSIS — R7309 Other abnormal glucose: Secondary | ICD-10-CM | POA: Insufficient documentation

## 2022-08-07 LAB — BASIC METABOLIC PANEL
Anion gap: 11 (ref 5–15)
BUN: 13 mg/dL (ref 6–20)
CO2: 24 mmol/L (ref 22–32)
Calcium: 9.4 mg/dL (ref 8.9–10.3)
Chloride: 100 mmol/L (ref 98–111)
Creatinine, Ser: 0.77 mg/dL (ref 0.44–1.00)
GFR, Estimated: >60 mL/min (ref >60)
Glucose, Bld: 287 mg/dL — ABNORMAL HIGH (ref 70–99)
Potassium: 3.9 mmol/L (ref 3.5–5.1)
Sodium: 135 mmol/L (ref 135–145)

## 2022-08-07 LAB — CBC WITH DIFFERENTIAL/PLATELET
Abs Immature Granulocytes: 0.02 10*3/uL (ref 0.00–0.07)
Basophils Absolute: 0 10*3/uL (ref 0.0–0.1)
Basophils Relative: 0 %
Eosinophils Absolute: 0.1 10*3/uL (ref 0.0–0.5)
Eosinophils Relative: 1 %
HCT: 39.5 % (ref 36.0–46.0)
Hemoglobin: 13.2 g/dL (ref 12.0–15.0)
Immature Granulocytes: 0 %
Lymphocytes Relative: 19 %
Lymphs Abs: 1.8 10*3/uL (ref 0.7–4.0)
MCH: 26.3 pg (ref 26.0–34.0)
MCHC: 33.4 g/dL (ref 30.0–36.0)
MCV: 78.7 fL — ABNORMAL LOW (ref 80.0–100.0)
Monocytes Absolute: 0.5 10*3/uL (ref 0.1–1.0)
Monocytes Relative: 6 %
Neutro Abs: 7 10*3/uL (ref 1.7–7.7)
Neutrophils Relative %: 74 %
Platelets: 240 10*3/uL (ref 150–400)
RBC: 5.02 MIL/uL (ref 3.87–5.11)
RDW: 12.9 % (ref 11.5–15.5)
WBC: 9.5 10*3/uL (ref 4.0–10.5)
nRBC: 0 % (ref 0.0–0.2)

## 2022-08-07 LAB — TROPONIN I (HIGH SENSITIVITY): Troponin I (High Sensitivity): 4 ng/L (ref <18)

## 2022-08-07 LAB — CBG MONITORING, ED
Glucose-Capillary: 285 mg/dL — ABNORMAL HIGH (ref 70–99)
Glucose-Capillary: 232 mg/dL — ABNORMAL HIGH (ref 70–99)

## 2022-08-07 MED ORDER — PROCHLORPERAZINE EDISYLATE 10 MG/2ML IJ SOLN
10.0000 mg | Freq: Once | INTRAMUSCULAR | Status: AC
  Administered 2022-08-07: 10 mg via INTRAVENOUS
  Filled 2022-08-07: qty 2

## 2022-08-07 MED ORDER — SODIUM CHLORIDE 0.9 % IV BOLUS
1000.0000 mL | Freq: Once | INTRAVENOUS | Status: AC
  Administered 2022-08-07: 1000 mL via INTRAVENOUS

## 2022-08-07 MED ORDER — METFORMIN HCL 500 MG PO TABS: 500.0000 mg | ORAL_TABLET | Freq: Two times a day (BID) | ORAL | 2 refills | Status: DC

## 2022-08-07 NOTE — ED Provider Notes (Signed)
[facility] Provider Note    Event Date/Time   First MD Initiated Contact with Patient 08/07/22 2050     (approximate)   History   Headache   HPI  Emily Blevins is a 36 y.o. female who presents to the emergency department today because of concerns for headaches, right arm tingling as well as body aches.  Patient states that her symptoms started 2 days ago.  She did have severe anxiety.  She states that she has issues with anxiety.  Yesterday she was very fatigued and slept throughout the day.  The headache is located behind her eyes.  The tingling is in her right arms.  She has had history of migraines since having COVID a couple of years ago.  She has however never had tingling with this.  The patient denies any recent fevers.  Denies any trauma to her head.  Physical Exam   Triage Vital Signs: ED Triage Vitals  Enc Vitals Group     BP 08/07/22 1643 (!) 170/108     Pulse Rate 08/07/22 1643 90     Resp 08/07/22 1643 18     Temp 08/07/22 1643 98.9 F (37.2 C)     Temp Source 08/07/22 1643 Oral     SpO2 08/07/22 1643 96 %     Weight 08/07/22 1644 277 lb (125.6 kg)     Height 08/07/22 1644 5\' 8"  (1.727 m)                            Most recent vital signs: Vitals:   08/07/22 1643  BP: (!) 170/108  Pulse: 90  Resp: 18  Temp: 98.9 F (37.2 C)  SpO2: 96%   General: Awake, alert, oriented. CV:  Good peripheral perfusion. Regular rate and rhythm. Resp:  Normal effort. Lungs lcear. Abd:  No distention.    ED Results / Procedures / Treatments   Labs (all labs ordered are listed, but only abnormal results are displayed) Labs Reviewed  BASIC METABOLIC PANEL - Abnormal; Notable for the following components:      Result Value   Glucose, Bld 287 (*)    All other components within normal limits  CBC WITH DIFFERENTIAL/PLATELET - Abnormal; Notable for the following components:   MCV 78.7 (*)    All other components within normal limits   CBG MONITORING, ED - Abnormal; Notable for the following components:   Glucose-Capillary 285 (*)    All other components within normal limits  TROPONIN I (HIGH SENSITIVITY)  TROPONIN I (HIGH SENSITIVITY)     EKG  I, 08/09/22, attending physician, personally viewed and interpreted this EKG  EKG Time: 1706 Rate: 116 Rhythm: sinus tachycardia Axis: rightward axis Intervals: qtc 439 QRS: incomplete RBBB, LPFB ST changes: no st elevation Impression: abnormal ekg   RADIOLOGY I independently interpreted and visualized the CXR. My interpretation: No pneumonia Radiology interpretation:  IMPRESSION:  No active cardiopulmonary disease.      PROCEDURES:  Critical Care performed: No  Procedures   MEDICATIONS ORDERED IN ED: Medications - No data to display   IMPRESSION / MDM / ASSESSMENT AND PLAN / ED COURSE  I reviewed the triage vital signs and the nursing notes.                              Differential diagnosis includes, but is not limited to, intracranial process,  complex migraine, anemia, electrolyte abnormality.  Patient's presentation is most consistent with acute presentation with potential threat to life or bodily function.  Patient presented to the emergency department today because of concerns for headache as well as some right sided upper extremity tingling.  Head CT did not show any concerning bleed or mass.  Blood work was notable for elevated blood sugar.  Patient was given IV fluids as well as medication to help with the headache. Patient states that she is currently not taking her metformin, which likely explains the elevated blood sugar. She did feel better here. Do think it is reasonable for patient to be discharged at this time. Will give PCP follow up information.    FINAL CLINICAL IMPRESSION(S) / ED DIAGNOSES   Headache Elevated blood sugar  Note:  This document was prepared using Dragon voice recognition software and may include  unintentional dictation errors.    Phineas Semen, MD 08/07/22 [phone]

## 2022-08-07 NOTE — ED Triage Notes (Signed)
Friday night had severe anxiety and slept all day Saturday. States her BP was elevated and has no history of high BP.  Now has headache, chest pain and stomach pain with associated right sided tingling. Pt states she has had migraines since she had covid in 2021. Pt states this feels like migraine but has never had tingling with it.

## 2022-08-07 NOTE — Discharge Instructions (Addendum)
Please see primary care to help manage diabetes and blood pressure. Please seek medical attention for any high fevers, chest pain, shortness of breath, change in behavior, persistent vomiting, bloody stool or any other new or concerning symptoms.

## 2022-08-19 DIAGNOSIS — Z419 Encounter for procedure for purposes other than remedying health state, unspecified: Secondary | ICD-10-CM | POA: Diagnosis not present

## 2022-09-19 DIAGNOSIS — Z419 Encounter for procedure for purposes other than remedying health state, unspecified: Secondary | ICD-10-CM | POA: Diagnosis not present

## 2022-10-14 ENCOUNTER — Telehealth: Payer: Self-pay

## 2022-10-14 NOTE — Telephone Encounter (Signed)
Mychart msg sent. AS, CMA 

## 2022-10-20 DIAGNOSIS — Z419 Encounter for procedure for purposes other than remedying health state, unspecified: Secondary | ICD-10-CM | POA: Diagnosis not present

## 2022-11-18 DIAGNOSIS — Z419 Encounter for procedure for purposes other than remedying health state, unspecified: Secondary | ICD-10-CM | POA: Diagnosis not present
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
## 2022-12-19 DIAGNOSIS — Z419 Encounter for procedure for purposes other than remedying health state, unspecified: Secondary | ICD-10-CM | POA: Diagnosis not present

## 2023-01-18 DIAGNOSIS — Z419 Encounter for procedure for purposes other than remedying health state, unspecified: Secondary | ICD-10-CM | POA: Diagnosis not present

## 2023-02-18 DIAGNOSIS — Z419 Encounter for procedure for purposes other than remedying health state, unspecified: Secondary | ICD-10-CM | POA: Diagnosis not present

## 2023-03-20 DIAGNOSIS — Z419 Encounter for procedure for purposes other than remedying health state, unspecified: Secondary | ICD-10-CM | POA: Diagnosis not present

## 2023-03-25 ENCOUNTER — Encounter: Payer: Self-pay | Admitting: Emergency Medicine

## 2023-03-25 ENCOUNTER — Emergency Department
Admission: EM | Admit: 2023-03-25 | Discharge: 2023-03-25 | Disposition: A | Discharge status: Home / Self Care | Payer: Medicaid Other | Attending: Emergency Medicine | Admitting: Emergency Medicine

## 2023-03-25 ENCOUNTER — Other Ambulatory Visit: Payer: Self-pay

## 2023-03-25 DIAGNOSIS — L02411 Cutaneous abscess of right axilla: Secondary | ICD-10-CM | POA: Diagnosis not present

## 2023-03-25 MED ORDER — HYDROCODONE-ACETAMINOPHEN 5-325 MG PO TABS: 1.0000 | ORAL_TABLET | Freq: Four times a day (QID) | ORAL | 0 refills | Status: DC | PRN

## 2023-03-25 MED ORDER — CEPHALEXIN 500 MG PO CAPS: 500.0000 mg | ORAL_CAPSULE | Freq: Three times a day (TID) | ORAL | 0 refills | Status: DC

## 2023-03-25 MED ORDER — SULFAMETHOXAZOLE-TRIMETHOPRIM 800-160 MG PO TABS: 1.0000 | ORAL_TABLET | Freq: Two times a day (BID) | ORAL | 0 refills | Status: DC

## 2023-03-25 NOTE — ED Triage Notes (Signed)
Pt to ED via POV for abscess under her right underarm x 3-4 days. Pt is in NAD.

## 2023-03-25 NOTE — Discharge Instructions (Signed)
Return to the emergency department for incision and drainage if this area continues to get red and swollen.  Continue using warm moist compresses to the area frequently.  2 antibiotics and 1 medicine for pain was sent to the pharmacy for you to begin taking today.

## 2023-03-25 NOTE — ED Provider Notes (Signed)
[facility] Provider Note    Event Date/Time   First MD Initiated Contact with Patient 03/25/23 1322     (approximate)   History   Abscess   HPI  Emily Blevins is a 37 y.o. female to the ED with complaint of possible abscess to her right axilla.  Patient states for the last 2 to 3 days she has had a small bump that has now became tender.  Patient has been using warm compresses to the area.  She denies any fever or chills.  She states that overall she does feel achy.  Patient has a history of hypertension and diabetes.     Physical Exam   Triage Vital Signs: ED Triage Vitals  Enc Vitals Group     BP 03/25/23 1305 108/84     Pulse Rate 03/25/23 1305 (!) 117     Resp 03/25/23 1305 18     Temp 03/25/23 1305 98.4 F (36.9 C)     Temp Source 03/25/23 1305 Oral     SpO2 03/25/23 1305 99 %     Weight 03/25/23 1306 280 lb (127 kg)     Height 03/25/23 1306 5\' 8"  (1.727 m)     Head Circumference --      Peak Flow --      Pain Score 03/25/23 1306 10     Pain Loc --      Pain Edu? --      Excl. in GC? --     Most recent vital signs: Vitals:   03/25/23 1305  BP: 108/84  Pulse: (!) 117  Resp: 18  Temp: 98.4 F (36.9 C)  SpO2: 99%     General: Awake, no distress.  CV:  Good peripheral perfusion.  Resp:  Normal effort.  Abd:  No distention.  Other:  Right axilla without erythema.  There is an area that is tender to palpation, mobile, firm.  No area of fluctuance.  Early formation of abscess most likely.   ED Results / Procedures / Treatments   Labs (all labs ordered are listed, but only abnormal results are displayed) Labs Reviewed - No data to display    PROCEDURES:  Critical Care performed:   Procedures   MEDICATIONS ORDERED IN ED: Medications - No data to display   IMPRESSION / MDM / ASSESSMENT AND PLAN / ED COURSE  I reviewed the triage vital signs and the nursing notes.   Differential diagnosis includes, but is not  limited to, abscess, cellulitis, lymphadenitis, ingrown hair follicle.  37 year old female presents to the ED with early abscess formation right axilla.  On exam there is no erythema or fluctuation.  I discussed with patient to continue using warm moist compresses to the area and will start antibiotics and pain medication.  She is instructed to return to the emergency department for I&D if this continues.  Patient reports that she does not think that she is allergic to penicillins anymore.  I discussed cephalexin with her along with Bactrim DS and hydrocodone for pain.      Patient's presentation is most consistent with acute, uncomplicated illness.  FINAL CLINICAL IMPRESSION(S) / ED DIAGNOSES   Final diagnoses:  Abscess of axilla, right     Rx / DC Orders   ED Discharge Orders          Ordered    HYDROcodone-acetaminophen (NORCO/VICODIN) 5-325 MG tablet  Every 6 hours PRN        03/25/23 1339  cephALEXin (KEFLEX) 500 MG capsule  3 times daily        03/25/23 1339    sulfamethoxazole-trimethoprim (BACTRIM DS) 800-160 MG tablet  2 times daily        03/25/23 1339             Note:  This document was prepared using Dragon voice recognition software and may include unintentional dictation errors.   Tommi Rumps, PA-C 03/25/23 1351    Sharyn Creamer, MD 03/25/23 [phone]

## 2023-04-20 DIAGNOSIS — Z419 Encounter for procedure for purposes other than remedying health state, unspecified: Secondary | ICD-10-CM | POA: Diagnosis not present

## 2023-05-21 DIAGNOSIS — Z419 Encounter for procedure for purposes other than remedying health state, unspecified: Secondary | ICD-10-CM | POA: Diagnosis not present

## 2023-06-20 DIAGNOSIS — Z419 Encounter for procedure for purposes other than remedying health state, unspecified: Secondary | ICD-10-CM | POA: Diagnosis not present

## 2023-07-14 ENCOUNTER — Encounter: Payer: Self-pay | Admitting: Nurse Practitioner

## 2023-07-14 ENCOUNTER — Ambulatory Visit (INDEPENDENT_AMBULATORY_CARE_PROVIDER_SITE_OTHER): Payer: Medicaid Other | Admitting: Nurse Practitioner

## 2023-07-14 ENCOUNTER — Telehealth: Payer: Self-pay | Admitting: Nurse Practitioner

## 2023-07-14 VITALS — BP 128/86 | HR 74 | Temp 97.8°F | Ht 68.0 in | Wt 270.2 lb

## 2023-07-14 DIAGNOSIS — F411 Generalized anxiety disorder: Secondary | ICD-10-CM

## 2023-07-14 DIAGNOSIS — Z114 Encounter for screening for human immunodeficiency virus [HIV]: Secondary | ICD-10-CM

## 2023-07-14 DIAGNOSIS — E1165 Type 2 diabetes mellitus with hyperglycemia: Secondary | ICD-10-CM | POA: Insufficient documentation

## 2023-07-14 DIAGNOSIS — Z7689 Persons encountering health services in other specified circumstances: Secondary | ICD-10-CM

## 2023-07-14 DIAGNOSIS — Z7984 Long term (current) use of oral hypoglycemic drugs: Secondary | ICD-10-CM

## 2023-07-14 DIAGNOSIS — Z1159 Encounter for screening for other viral diseases: Secondary | ICD-10-CM

## 2023-07-14 LAB — COMPREHENSIVE METABOLIC PANEL
ALT: 18 U/L (ref 0–35)
AST: 11 U/L (ref 0–37)
Albumin: 4.5 g/dL (ref 3.5–5.2)
Alkaline Phosphatase: 63 U/L (ref 39–117)
BUN: 15 mg/dL (ref 6–23)
CO2: 30 meq/L (ref 19–32)
Calcium: 9.9 mg/dL (ref 8.4–10.5)
Chloride: 100 meq/L (ref 96–112)
Creatinine, Ser: 0.71 mg/dL (ref 0.40–1.20)
GFR: 107.81 mL/min (ref >60.00)
Glucose, Bld: 207 mg/dL — ABNORMAL HIGH (ref 70–99)
Potassium: 4.3 meq/L (ref 3.5–5.1)
Sodium: 138 meq/L (ref 135–145)
Total Bilirubin: 0.4 mg/dL (ref 0.2–1.2)
Total Protein: 7.6 g/dL (ref 6.0–8.3)

## 2023-07-14 LAB — POCT GLYCOSYLATED HEMOGLOBIN (HGB A1C): Hemoglobin A1C: 7.9 % — AB (ref 4.0–5.6)

## 2023-07-14 LAB — LIPID PANEL
Cholesterol: 172 mg/dL (ref 0–200)
HDL: 44.6 mg/dL (ref >39.00)
LDL Cholesterol: 79 mg/dL (ref 0–99)
NonHDL: 127.2
Total CHOL/HDL Ratio: 4
Triglycerides: 239 mg/dL — ABNORMAL HIGH (ref 0.0–149.0)
VLDL: 47.8 mg/dL — ABNORMAL HIGH (ref 0.0–40.0)

## 2023-07-14 LAB — CBC
HCT: 39.6 % (ref 36.0–46.0)
Hemoglobin: 12.5 g/dL (ref 12.0–15.0)
MCHC: 31.6 g/dL (ref 30.0–36.0)
MCV: 78 fL (ref 78.0–100.0)
Platelets: 256 10*3/uL (ref 150.0–400.0)
RBC: 5.07 Mil/uL (ref 3.87–5.11)
RDW: 15.6 % — ABNORMAL HIGH (ref 11.5–15.5)
WBC: 8.7 10*3/uL (ref 4.0–10.5)

## 2023-07-14 LAB — TSH: TSH: 3.31 u[IU]/mL (ref 0.35–5.50)

## 2023-07-14 LAB — MICROALBUMIN / CREATININE URINE RATIO
Creatinine,U: 103.2 mg/dL
Microalb Creat Ratio: 1.6 mg/g (ref 0.0–30.0)
Microalb, Ur: 1.6 mg/dL (ref 0.0–1.9)

## 2023-07-14 MED ORDER — SERTRALINE HCL 50 MG PO TABS: ORAL_TABLET | ORAL | 0 refills | Status: DC

## 2023-07-14 MED ORDER — METFORMIN HCL 500 MG PO TABS: 500.0000 mg | ORAL_TABLET | Freq: Two times a day (BID) | ORAL | 0 refills | Status: DC

## 2023-07-14 NOTE — Patient Instructions (Signed)
Nice to see you today Your A1C was 7.9% I have sent the medications to the pharmacy I have put in the referral for the educator to reach out to you I will be in touch with the labs once I have reviewed them Follow up with me in 3 months for diabetes recheck

## 2023-07-14 NOTE — Telephone Encounter (Signed)
Pt mentioned during checkout that she gave 2 forms to [employer] during her np appt. Pt requested for a call back once ppw is comp. Call back # [phone]

## 2023-07-14 NOTE — Telephone Encounter (Signed)
Pt brought by a packet for [employer] to comp from [facility]. Pt requested for this packet to be faxed to company, fax # is [phone]. Call back # [phone]. Packet is in cable's folder.

## 2023-07-14 NOTE — Telephone Encounter (Signed)
Placed in your box for review.  °

## 2023-07-14 NOTE — Progress Notes (Signed)
New Patient Office Visit  Subjective    Patient ID: LIAH STARBUCK, female    DOB: March 02, 1986  Age: 37 y.o. MRN: [id number]  CC:  Chief Complaint  Patient presents with   Establish Care    Pt requests medication refill for Metformin, zoloft. Pt states that ozempic made her sick and would like to discuss alternatives.     HPI Juel Lindwall Platz presents to establish care   DM2: states that she was on metformin 500mg  BID and ozemoic in the past ozempic made her sick on the lowest dose. State that she would have cramping and vomiting. She has been out of the medication for approximately a year. States that she did a carinvore diet in the past and felt good. She does want to speak to diabetic educator.  She is also interested in a different injectable drug  Anxiety/depression: zofloft 50mg  daily. States that she has anxiety over taking medication. States that it took a while to get into her system. States that she has been anxious as of late. Worried about her children    Migraines: once a day that started since covid (2020) right temple that last for 10 minutes. States it feel like someone is pulling the eyes out. Blurred vision during the headahce. She can tell with the pressure behind eyes. No medications   Pap smear:2017.  Colonoscopy: Too young, currently average risk  Mammogram: Patient has a maternal aunt had breast cancer.  Mother and other females in the family but okay will do mammograms age 8  Tdap:within 10 years  BMW:[id number]  Covid: refused  Pna: Discussed in office Shingles: too young     Outpatient Encounter Medications as of 07/14/2023  Medication Sig   Blood Glucose Monitoring Suppl (ONETOUCH VERIO FLEX SYSTEM) w/Device KIT CHECK BLOOD SUGAR EVERY MORNING BEFORE BREAKFAST.   glucose blood (ONETOUCH VERIO) test strip CHECK BLOOD SUGAR BEFORE BREAKFAST EVERY MORNING   sulfamethoxazole-trimethoprim (BACTRIM DS) 800-160 MG tablet Take 1 tablet by mouth 2  (two) times daily.   [DISCONTINUED] metFORMIN (GLUCOPHAGE) 500 MG tablet Take 1 tablet (500 mg total) by mouth 2 (two) times daily.   [DISCONTINUED] sertraline (ZOLOFT) 50 MG tablet Take 50 mg by mouth 2 (two) times daily.   cetirizine (ZYRTEC ALLERGY) 10 MG tablet Take 1 tablet (10 mg total) by mouth daily for 7 days.   metFORMIN (GLUCOPHAGE) 500 MG tablet Take 1 tablet (500 mg total) by mouth 2 (two) times daily.   sertraline (ZOLOFT) 50 MG tablet Take 0.5 tablets (25 mg total) by mouth daily for 14 days, THEN 1 tablet (50 mg total) daily for 16 days.   [DISCONTINUED] cephALEXin (KEFLEX) 500 MG capsule Take 1 capsule (500 mg total) by mouth 3 (three) times daily.   [DISCONTINUED] HYDROcodone-acetaminophen (NORCO/VICODIN) 5-325 MG tablet Take 1 tablet by mouth every 6 (six) hours as needed for moderate pain.   No facility-administered encounter medications on file as of 07/14/2023.    Past Medical History:  Diagnosis Date   Asthma    Childhood   Diabetes mellitus without complication (HCC)    Hypertension     Past Surgical History:  Procedure Laterality Date   CESAREAN SECTION     x2    Family History  Problem Relation Age of Onset   Diabetes Mother    Heart attack Mother    Drug abuse Father    Alcohol abuse Father    Cancer Maternal Aunt 30       breast  cancer    Social History   Socioeconomic History   Marital status: Married    Spouse name: Christiane Ha   Number of children: 2   Years of education: Not on file   Highest education level: Not on file  Occupational History   Not on file  Tobacco Use   Smoking status: Never   Smokeless tobacco: Never  Vaping Use   Vaping status: Never Used  Substance and Sexual Activity   Alcohol use: No   Drug use: Not Currently   Sexual activity: Not on file  Other Topics Concern   Not on file  Social History Narrative   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  Respiratory:  Negative for shortness of breath.   Cardiovascular:  Negative for chest pain and leg swelling.  Gastrointestinal:  Negative for abdominal pain, blood in stool, constipation, diarrhea, nausea and vomiting.       BM daily   Genitourinary:  Negative for dysuria and hematuria.  Neurological:  Positive for headaches. Negative for tingling.  Psychiatric/Behavioral:  Negative for hallucinations and suicidal ideas.         Objective    BP 128/86   Pulse 74   Temp 97.8 F (36.6 C) (Oral)   Ht 5\' 8"  (1.727 m)   Wt 270 lb 3.2 oz (122.6 kg)   LMP 07/05/2023 (Exact Date)   SpO2 96%   BMI 41.08 kg/m   Physical Exam Vitals and nursing note reviewed.  Constitutional:      Appearance: Normal appearance.  HENT:     Right Ear: Tympanic membrane, ear canal and external ear normal.     Left Ear: Tympanic membrane, ear canal and external ear normal.     Mouth/Throat:     Mouth: Mucous membranes are moist.     Pharynx: Oropharynx is clear.  Eyes:     Extraocular Movements: Extraocular movements intact.     Pupils: Pupils are equal, round, and reactive to light.  Cardiovascular:     Rate and Rhythm: Normal rate and regular rhythm.     Pulses: Normal pulses.     Heart sounds: Normal heart sounds.  Pulmonary:     Effort: Pulmonary effort is normal.     Breath sounds: Normal breath sounds.  Abdominal:     General: Bowel sounds are normal. There is no distension.     Palpations: There is no mass.     Tenderness: There is no abdominal tenderness.     Hernia: No hernia is present.  Musculoskeletal:     Right lower leg: No edema.     Left lower leg: No edema.  Lymphadenopathy:      Cervical: No cervical adenopathy.  Skin:    General: Skin is warm.  Neurological:     General: No focal deficit present.     Mental Status: She is alert.     Deep Tendon Reflexes:     Reflex Scores:      Bicep reflexes are 2+ on the right side and 2+ on the left side.  Patellar reflexes are 2+ on the right side and 2+ on the left side.    Comments: Bilateral upper and lower extremity strength 5/5  Psychiatric:        Mood and Affect: Mood normal.        Behavior: Behavior normal.        Thought Content: Thought content normal.        Judgment: Judgment normal.    Title   Diabetic Foot Exam - detailed Is there a history of foot ulcer?: No Is there a foot ulcer now?: No Is there swelling?: No Is there elevated skin temperature?: No Is there abnormal foot shape?: No Is there a claw toe deformity?: No Are the toenails long?: No Are the toenails thick?: No Are the toenails ingrown?: No Is the skin thin, fragile, shiny and hairless?": No Pulse Foot Exam completed.: Yes   Right Posterior Tibialis: Present Left posterior Tibialis: Present   Right Dorsalis Pedis: Present Left Dorsalis Pedis: Present     Sensory Foot Exam Completed.: Yes Semmes-Weinstein Monofilament Test "+" means "has sensation" and "-" means "no sensation"      Image components are not supported.   Image components are not supported. Image components are not supported.  T[place] Comments  Right 5.9 absent  Left 6, 9, absent        Assessment & Plan:   Problem List Items Addressed This Visit       Endocrine   Type 2 diabetes mellitus with hyperglycemia, without long-term current use of insulin (HCC) - Primary    Patient states original A1c was in the 14's.  7.9% today.  Will start metformin 500 mg twice daily.  Ambulatory referral to diabetic educator.  Did request patient to check glucose once a day.  States she has supplies at home      Relevant Medications   metFORMIN (GLUCOPHAGE)  500 MG tablet   Other Relevant Orders   POCT glycosylated hemoglobin (Hb A1C) (Completed)   CBC   Comprehensive metabolic panel   Lipid panel   Microalbumin / creatinine urine ratio   Referral to Nutrition and Diabetes Services     Other   GAD (generalized anxiety disorder)    Patient has a history of anxiety in regards to worrying about one of her children passed away.  She was on sertraline 50 mg in the past.  Will start her back on that start 25 mg nightly for 14 days then titrate up to 50 mg daily thereafter.      Relevant Medications   sertraline (ZOLOFT) 50 MG tablet   Other Relevant Orders   TSH   Morbid obesity (HCC)    Pending TSH, lipid panel today.  Continue work on lifestyle modifications.      Relevant Medications   metFORMIN (GLUCOPHAGE) 500 MG tablet   Other Relevant Orders   TSH   Lipid panel   Other Visit Diagnoses     Establishing care with new doctor, encounter for       Encounter for hepatitis C screening test for low risk patient       Relevant Orders   Hepatitis C Antibody   Encounter for screening for HIV       Relevant Orders   HIV antibody (with reflex)       Return in about 3 months (around 10/14/2023) for DM recheck.   Audria Nine, NP

## 2023-07-14 NOTE — Assessment & Plan Note (Signed)
Patient states original A1c was in the 14's.  7.9% today.  Will start metformin 500 mg twice daily.  Ambulatory referral to diabetic educator.  Did request patient to check glucose once a day.  States she has supplies at home

## 2023-07-14 NOTE — Assessment & Plan Note (Signed)
Patient has a history of anxiety in regards to worrying about one of her children passed away.  She was on sertraline 50 mg in the past.  Will start her back on that start 25 mg nightly for 14 days then titrate up to 50 mg daily thereafter.

## 2023-07-14 NOTE — Assessment & Plan Note (Signed)
Pending TSH, lipid panel today.  Continue work on lifestyle modifications.

## 2023-07-14 NOTE — Telephone Encounter (Signed)
Forms have been given directly to PCP. Will call once forms have been completed.

## 2023-07-14 NOTE — Telephone Encounter (Signed)
Called pt. No answer and Unable to leave voicemail.

## 2023-07-15 LAB — HIV ANTIBODY (ROUTINE TESTING W REFLEX): HIV 1&2 Ab, 4th Generation: NONREACTIVE

## 2023-07-15 LAB — HEPATITIS C ANTIBODY: Hepatitis C Ab: NONREACTIVE

## 2023-07-17 ENCOUNTER — Other Ambulatory Visit: Payer: Self-pay

## 2023-07-17 ENCOUNTER — Emergency Department: Payer: Medicaid Other

## 2023-07-17 ENCOUNTER — Emergency Department
Admission: EM | Admit: 2023-07-17 | Discharge: 2023-07-17 | Disposition: A | Discharge status: Home / Self Care | Payer: Medicaid Other | Attending: Emergency Medicine | Admitting: Emergency Medicine

## 2023-07-17 DIAGNOSIS — G510 Bell's palsy: Secondary | ICD-10-CM | POA: Diagnosis not present

## 2023-07-17 DIAGNOSIS — I10 Essential (primary) hypertension: Secondary | ICD-10-CM | POA: Diagnosis not present

## 2023-07-17 DIAGNOSIS — R29810 Facial weakness: Secondary | ICD-10-CM | POA: Diagnosis not present

## 2023-07-17 DIAGNOSIS — J45909 Unspecified asthma, uncomplicated: Secondary | ICD-10-CM | POA: Diagnosis not present

## 2023-07-17 DIAGNOSIS — R200 Anesthesia of skin: Secondary | ICD-10-CM | POA: Diagnosis not present

## 2023-07-17 LAB — DIFFERENTIAL
Abs Immature Granulocytes: 0.02 10*3/uL (ref 0.00–0.07)
Basophils Absolute: 0 10*3/uL (ref 0.0–0.1)
Basophils Relative: 0 %
Eosinophils Absolute: 0.1 10*3/uL (ref 0.0–0.5)
Eosinophils Relative: 1 %
Immature Granulocytes: 0 %
Lymphocytes Relative: 26 %
Lymphs Abs: 2.2 10*3/uL (ref 0.7–4.0)
Monocytes Absolute: 0.5 10*3/uL (ref 0.1–1.0)
Monocytes Relative: 5 %
Neutro Abs: 5.5 10*3/uL (ref 1.7–7.7)
Neutrophils Relative %: 68 %

## 2023-07-17 LAB — COMPREHENSIVE METABOLIC PANEL
ALT: 21 U/L (ref 0–44)
AST: 19 U/L (ref 15–41)
Albumin: 4.3 g/dL (ref 3.5–5.0)
Alkaline Phosphatase: 57 U/L (ref 38–126)
Anion gap: 9 (ref 5–15)
BUN: 15 mg/dL (ref 6–20)
CO2: 25 mmol/L (ref 22–32)
Calcium: 9.2 mg/dL (ref 8.9–10.3)
Chloride: 102 mmol/L (ref 98–111)
Creatinine, Ser: 0.65 mg/dL (ref 0.44–1.00)
GFR, Estimated: >60 mL/min (ref >60)
Glucose, Bld: 206 mg/dL — ABNORMAL HIGH (ref 70–99)
Potassium: 4.2 mmol/L (ref 3.5–5.1)
Sodium: 136 mmol/L (ref 135–145)
Total Bilirubin: 0.6 mg/dL (ref 0.3–1.2)
Total Protein: 7.8 g/dL (ref 6.5–8.1)

## 2023-07-17 LAB — APTT: aPTT: 26 s (ref 24–36)

## 2023-07-17 LAB — CBC
HCT: 39.7 % (ref 36.0–46.0)
Hemoglobin: 12.6 g/dL (ref 12.0–15.0)
MCH: 24.6 pg — ABNORMAL LOW (ref 26.0–34.0)
MCHC: 31.7 g/dL (ref 30.0–36.0)
MCV: 77.5 fL — ABNORMAL LOW (ref 80.0–100.0)
Platelets: 264 10*3/uL (ref 150–400)
RBC: 5.12 MIL/uL — ABNORMAL HIGH (ref 3.87–5.11)
RDW: 13.9 % (ref 11.5–15.5)
WBC: 8.3 10*3/uL (ref 4.0–10.5)
nRBC: 0 % (ref 0.0–0.2)

## 2023-07-17 LAB — PROTIME-INR
INR: 1 (ref 0.8–1.2)
Prothrombin Time: 13.5 s (ref 11.4–15.2)

## 2023-07-17 LAB — ETHANOL: Alcohol, Ethyl (B): <10 mg/dL (ref <10)

## 2023-07-17 MED ORDER — PREDNISONE 50 MG PO TABS: ORAL_TABLET | ORAL | 0 refills | Status: DC

## 2023-07-17 MED ORDER — HYPROMELLOSE (GONIOSCOPIC) 2.5 % OP SOLN: 2.0000 [drp] | Freq: Four times a day (QID) | OPHTHALMIC | 12 refills | Status: DC

## 2023-07-17 MED ORDER — ACYCLOVIR 400 MG PO TABS: 400.0000 mg | ORAL_TABLET | Freq: Every day | ORAL | 0 refills | Status: AC

## 2023-07-17 NOTE — ED Provider Notes (Signed)
[facility] Provider Note    Event Date/Time   First MD Initiated Contact with Patient 07/17/23 [phone]     (approximate)   History   Numbness   HPI  Emily Blevins is a 37 y.o. female   Past medical history of asthma and diabetes who presents to the emergency department with right-sided facial paralysis noticed this morning.  She noted that water was dribbling out of her mouth when she was brushing her teeth.  No recent illnesses, no pain, no other acute medical complaints.   External Medical Documents Reviewed: Primary care note from earlier this week documented past medical history and medications      Physical Exam   Triage Vital Signs: ED Triage Vitals  Encounter Vitals Group     BP 07/17/23 0735 (!) 173/103     Systolic BP Percentile --      Diastolic BP Percentile --      Pulse Rate 07/17/23 0735 76     Resp 07/17/23 0735 20     Temp 07/17/23 0735 98 F (36.7 C)     Temp src --      SpO2 07/17/23 0735 100 %     Weight 07/17/23 0735 268 lb 15.4 oz (122 kg)     Height 07/17/23 0735 5\' 8"  (1.727 m)     Head Circumference --      Peak Flow --      Pain Score 07/17/23 0736 0     Pain Loc --      Pain Education --      Exclude from Growth Chart --     Most recent vital signs: Vitals:   07/17/23 0735  BP: (!) 173/103  Pulse: 76  Resp: 20  Temp: 98 F (36.7 C)  SpO2: 100%    General: Awake, no distress.  CV:  Good peripheral perfusion.  Resp:  Normal effort.  Abd:  No distention.  Other:  Awake alert comfortable with right-sided facial paralysis involving both the upper and lower parts of her face.  No other focal neurologic deficits on motor or sensory exam throughout the body.  No lesions of the face, eyes, nose or ears.   ED Results / Procedures / Treatments   Labs (all labs ordered are listed, but only abnormal results are displayed) Labs Reviewed  CBC - Abnormal; Notable for the following components:      Result  Value   RBC 5.12 (*)    MCV 77.5 (*)    MCH 24.6 (*)    All other components within normal limits  COMPREHENSIVE METABOLIC PANEL - Abnormal; Notable for the following components:   Glucose, Bld 206 (*)    All other components within normal limits  PROTIME-INR  APTT  DIFFERENTIAL  ETHANOL  POC URINE PREG, ED     I ordered and reviewed the above labs they are notable for blood sugar is mildly elevated at 200 otherwise cell counts and electrolytes within normal limits.    EKG  ED ECG REPORT I, Pilar Jarvis, the attending physician, personally viewed and interpreted this ECG.   Date: 07/17/2023  EKG Time: 0747  Rate: 73  Rhythm: sinus  Axis: nl  Intervals:lpfb  ST&T Change: no stemi    RADIOLOGY I independently reviewed and interpreted CT scan of the head and see no obvious bleeding or midline shift I also reviewed radiologist's formal read.   PROCEDURES:  Critical Care performed: No  Procedures   MEDICATIONS ORDERED  IN ED: Medications - No data to display   IMPRESSION / MDM / ASSESSMENT AND PLAN / ED COURSE  I reviewed the triage vital signs and the nursing notes.                                Patient's presentation is most consistent with acute presentation with potential threat to life or bodily function.  Differential diagnosis includes, but is not limited to, Bell's palsy, CVA less likely   The patient is on the cardiac monitor to evaluate for evidence of arrhythmia and/or significant heart rate changes.  MDM:    Clinical exam consistent with Bell's palsy.  No signs of viral infection.  No other acute neurologic deficits so doubt stroke.  CT negative, labs within normal limits.  Started on acyclovir and prednisone, artificial tears and advised to tape eyes shut at nighttime to prevent drying.  Follow-up with PMD.       FINAL CLINICAL IMPRESSION(S) / ED DIAGNOSES   Final diagnoses:  Bell's palsy     Rx / DC Orders   ED Discharge Orders           Ordered    predniSONE (DELTASONE) 50 MG tablet        07/17/23 0840    acyclovir (ZOVIRAX) 400 MG tablet  5 times daily        07/17/23 0840    hydroxypropyl methylcellulose / hypromellose (ISOPTO TEARS / GONIOVISC) 2.5 % ophthalmic solution  4 times daily        07/17/23 0840             Note:  This document was prepared using Dragon voice recognition software and may include unintentional dictation errors.    Pilar Jarvis, MD 07/17/23 [phone]

## 2023-07-17 NOTE — ED Triage Notes (Signed)
Pt to ED for numbness to tongue started at midnight. Noticed left facial droop at 0600 this morning when woke up. Denies weakness or dizziness.

## 2023-07-17 NOTE — Discharge Instructions (Addendum)
Take antiviral medication and prednisone for 1 week as prescribed.  Follow-up with your doctor.  Use eyedrops daily to keep the right eye moisturized and tape your eye shut when sleeping to prevent it from drying.  Thank you for choosing [place] for your health care today!  Please see your primary doctor this week for a follow up appointment.  You got a CT scan of your head that showed a partially empty sella, which is an incidental finding not related to your symptoms.  Discuss this finding with your primary doctor and further evaluation or repeat imaging as needed.  If you have any new, worsening, or unexpected symptoms call your doctor right away or come back to the emergency department for reevaluation.  It was my pleasure to care for you today.   Daneil Dan Modesto Charon, MD

## 2023-07-19 NOTE — Telephone Encounter (Signed)
Forms have been completed.

## 2023-07-21 ENCOUNTER — Ambulatory Visit: Payer: Medicaid Other | Admitting: Nurse Practitioner

## 2023-07-21 ENCOUNTER — Ambulatory Visit (INDEPENDENT_AMBULATORY_CARE_PROVIDER_SITE_OTHER): Payer: Medicaid Other | Admitting: Nurse Practitioner

## 2023-07-21 VITALS — BP 130/74 | HR 74 | Temp 98.6°F | Ht 68.0 in | Wt 269.0 lb

## 2023-07-21 DIAGNOSIS — G510 Bell's palsy: Secondary | ICD-10-CM

## 2023-07-21 DIAGNOSIS — Z09 Encounter for follow-up examination after completed treatment for conditions other than malignant neoplasm: Secondary | ICD-10-CM

## 2023-07-21 DIAGNOSIS — R9389 Abnormal findings on diagnostic imaging of other specified body structures: Secondary | ICD-10-CM | POA: Diagnosis not present

## 2023-07-21 DIAGNOSIS — Z419 Encounter for procedure for purposes other than remedying health state, unspecified: Secondary | ICD-10-CM | POA: Diagnosis not present

## 2023-07-21 NOTE — Progress Notes (Signed)
Established Patient Office Visit  Subjective   Patient ID: Emily Blevins, female    DOB: 1985/12/05  Age: 37 y.o. MRN: [id number]  Chief Complaint  Patient presents with   Bell's Palsy    Seen in ED     HPI  Hosptial follow up: Patient was seen in the emergency department on 07/17/2023.  She was diagnosed with Bell's palsy.Patient came into the emergency department with right-sided facial paralysis that was noticed that morning she went to drink water and it was dribbling out of the side of her mouth.  Patient was noted to be hypertensive in the emergency department.  Patient had labs drawn EKG and a CT scan of the head.  She was placed on prednisone, acyclovir, artificial tears and instructed to follow-up with PCP.  State that she is taking the medications. She was not able to get articifcal tears. States that she is having a headace and right side vision blurriness at night. She is taping the eye. Fae feels the sae but otngue is more numb.     Review of Systems  Constitutional:  Negative for chills and fever.  Eyes:  Positive for blurred vision and pain.  Respiratory:  Negative for shortness of breath.   Cardiovascular:  Negative for chest pain.  Neurological:  Positive for tingling and weakness. Negative for headaches.  Psychiatric/Behavioral:  Negative for hallucinations and suicidal ideas.       Objective:     BP 130/74   Pulse 74   Temp 98.6 F (37 C) (Oral)   Ht 5\' 8"  (1.727 m)   Wt 269 lb (122 kg)   LMP 07/05/2023 (Exact Date)   SpO2 98%   BMI 40.90 kg/m    Physical Exam Vitals and nursing note reviewed.  Constitutional:      Appearance: Normal appearance.  HENT:     Right Ear: Tympanic membrane, ear canal and external ear normal.     Left Ear: Tympanic membrane, ear canal and external ear normal.     Mouth/Throat:     Mouth: Mucous membranes are moist.     Pharynx: Oropharynx is clear.  Eyes:     Extraocular Movements: Extraocular movements  intact.     Pupils: Pupils are equal, round, and reactive to light.  Cardiovascular:     Rate and Rhythm: Normal rate and regular rhythm.     Heart sounds: Normal heart sounds.  Pulmonary:     Effort: Pulmonary effort is normal.     Breath sounds: Normal breath sounds.  Neurological:     Mental Status: She is alert.     Sensory: Sensation is intact.     Comments: Right sided lid lag Mild facial drop/drawing Bilateral oropharynx moves with phonation       No results found for any visits on 07/21/23.    The ASCVD Risk score (Arnett DK, et al., 2019) failed to calculate for the following reasons:   The 2019 ASCVD risk score is only valid for ages 72 to 27    Assessment & Plan:   Problem List Items Addressed This Visit       Nervous and Auditory   Bell's palsy - Primary    Continue acyclovir as prescribed continue prednisone as prescribed.  Patient informed of artificial tears and to take the at night.  Patient also informed to be mindful at her work about wearing protective eye gear to prevent foreign bodies from hitting the eye.  Ambulatory referral to neurology today  Relevant Orders   Ambulatory referral to Neurology     Other   Abnormal CT scan    Abnormal CT scan of the head shows possible anatomic variation or idiopathic intracranial hypertension ambulatory referral to neurology placed today      Relevant Orders   Ambulatory referral to Neurology   Hospital discharge follow-up    Did review the ED note, labs, imaging.       Return if symptoms worsen or fail to improve.    Audria Nine, NP

## 2023-07-21 NOTE — Assessment & Plan Note (Signed)
Did review the ED note, labs, imaging.

## 2023-07-21 NOTE — Patient Instructions (Signed)
Nice to see you today The prednisone will cause you sugar to be high Protect the eye. Get artifical tears and tape the eye at night Follow up with me as scheduled, sooner if you need me

## 2023-07-21 NOTE — Assessment & Plan Note (Signed)
Continue acyclovir as prescribed continue prednisone as prescribed.  Patient informed of artificial tears and to take the at night.  Patient also informed to be mindful at her work about wearing protective eye gear to prevent foreign bodies from hitting the eye.  Ambulatory referral to neurology today

## 2023-07-21 NOTE — Assessment & Plan Note (Signed)
Abnormal CT scan of the head shows possible anatomic variation or idiopathic intracranial hypertension ambulatory referral to neurology placed today

## 2023-08-05 ENCOUNTER — Other Ambulatory Visit: Payer: Self-pay | Admitting: Nurse Practitioner

## 2023-08-05 DIAGNOSIS — F411 Generalized anxiety disorder: Secondary | ICD-10-CM

## 2023-08-15 ENCOUNTER — Telehealth: Payer: Self-pay | Admitting: Nurse Practitioner

## 2023-08-15 NOTE — Telephone Encounter (Signed)
Contacted pt to gather more information.  Pt complains of needing documentation stating that she is healthy to donate plasma after two failed iron tests.  She states the pass rate is 38? And she has fallen to 36 twice.  She says that [facility] will allow her to continue donating as long as she gets approval from PCP. Please advise!

## 2023-08-15 NOTE — Telephone Encounter (Signed)
Patient came by and picked up paperwork. She stated that she is needing something that states she is healthy enough to donate. Please advise. Thank you!

## 2023-08-16 NOTE — Telephone Encounter (Signed)
Contact pt to advise her to retrieve a form from [employer] stating that she is healthy to donate plasma.  (The forms that were provided are from [employer] for diabetes and [employer].)   Pt verbalized understanding and stated she will reach out to them.

## 2023-08-16 NOTE — Telephone Encounter (Signed)
I have filled out two of these forms for her in the past is there a different form that is needed?

## 2023-08-20 DIAGNOSIS — Z419 Encounter for procedure for purposes other than remedying health state, unspecified: Secondary | ICD-10-CM | POA: Diagnosis not present

## 2023-08-23 ENCOUNTER — Ambulatory Visit: Payer: Medicaid Other | Admitting: Neurology

## 2023-08-23 ENCOUNTER — Encounter: Payer: Self-pay | Admitting: Neurology

## 2023-08-23 VITALS — BP 142/94 | HR 82 | Ht 68.0 in | Wt 274.0 lb

## 2023-08-23 DIAGNOSIS — H538 Other visual disturbances: Secondary | ICD-10-CM

## 2023-08-23 DIAGNOSIS — Z9189 Other specified personal risk factors, not elsewhere classified: Secondary | ICD-10-CM

## 2023-08-23 DIAGNOSIS — G510 Bell's palsy: Secondary | ICD-10-CM | POA: Diagnosis not present

## 2023-08-23 DIAGNOSIS — E236 Other disorders of pituitary gland: Secondary | ICD-10-CM

## 2023-08-23 DIAGNOSIS — R519 Headache, unspecified: Secondary | ICD-10-CM | POA: Diagnosis not present

## 2023-08-23 DIAGNOSIS — R030 Elevated blood-pressure reading, without diagnosis of hypertension: Secondary | ICD-10-CM

## 2023-08-23 DIAGNOSIS — F419 Anxiety disorder, unspecified: Secondary | ICD-10-CM

## 2023-08-23 NOTE — Progress Notes (Signed)
Subjective:    Patient ID: Emily Blevins is a 37 y.o. female.  HPI    Huston Foley, MD, PhD [facility] [address], [address] [address] [place], [place] [address]  Dear Emily Blevins,  I saw your patient, Emily Blevins, upon your kind request in my neurologic clinic today for initial consultation of her recent Bell's palsy and finding of empty sella on her head CT.  The patient is unaccompanied today.  As you know Emily Blevins is a 37 year old female with an underlying medical history of asthma, hypertension, diabetes, and severe obesity with a BMI of over 40, who reports intermittent blurry vision on the right side. She had not really noticed the blurry vision until after the Bell's palsy.  She does feel improved but has some residual weakness when she blinks but she does have almost complete eye closure at this time.  She never ended up using any eyedrops because the prescription eyedrops were not available at her pharmacy and then she simply forgot getting any over-the-counter eyedrops.  She is anxious today as far as her empty sella and questions about it.  We addressed all her concerns today and are lengthy visit.   She had a recent bout of Bell's palsy for which she went to the emergency room.  I reviewed your office note from 07/21/2023.  She was still on prednisone and acyclovir at the time.  She was advised to complete the course of medications.  I also reviewed her emergency room record from 07/17/2023.  She presented to [facility] emergency room on 07/17/2023 with new onset right-sided facial paralysis.  She was felt to have Bell's palsy.  She was started on prednisone and Zovirax.  She was advised to use eyedrops 4 times a day.  She had a head CT without contrast on 07/17/2023 and I reviewed the results:  IMPRESSION: Normal noncontrast Head CT aside from partially empty sella, often a normal anatomic variant but can be associated with  idiopathic intracranial hypertension (pseudotumor cerebri).     In addition, I personally and independently reviewed images through the PACS system.  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  Otherwise normal CT scan appearance of her brain was noted.  She had an eye exam through [facility] in February 2024.  She has prescription eyeglasses.  She does not notice any loss of peripheral vision, no eye pain, has had intermittent blurry vision since the Bell's palsy.  She is working on weight loss.  She was on Ozempic but had significant side effects.  She has not seen a weight management clinic.  She works as a Scientist, research (physical sciences) in the lab.  She has 2 children, ages 74 and 52.  She is a non-smoker and drinks caffeine in the form of tea, 2 to 3 cups a day and 1 energy drink.  She is willing to reduce her caffeine intake particularly her energy drink intake.  She does not drink any alcohol.  She hydrates well with water.  She feels that her balance has been a little off since her Bell's palsy but she has not fallen thankfully.  She has had some ear pain on the right side which is better.  She has some ringing in the right ear still.  She has had some recurrent headaches.  Her Past Medical History Is Significant For: Past Medical History:  Diagnosis Date   Asthma    Childhood   Diabetes mellitus without complication (HCC)  Hypertension     Her Past Surgical History Is Significant For: Past Surgical History:  Procedure Laterality Date   CESAREAN SECTION     x2    Her Family History Is Significant For: Family History  Problem Relation Age of Onset   Diabetes Mother    Heart attack Mother    Drug abuse Father    Alcohol abuse Father    Cancer Maternal Aunt 30       breast cancer   Other Daughter        benign tumor behind eyes   Migraines Neg Hx    Bell's palsy Neg Hx     Her Social History Is Significant For: Social History   Socioeconomic History   Marital status:  Married    Spouse name: Emily Blevins   Number of children: 2   Years of education: Not on file   Highest education level: Not on file  Occupational History   Not on file  Tobacco Use   Smoking status: Never   Smokeless tobacco: Never  Vaping Use   Vaping status: Never Used  Substance and Sexual Activity   Alcohol use: No   Drug use: Not Currently   Sexual activity: Not on file  Other Topics Concern   Not on file  Social History Narrative   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   glucose blood (ONETOUCH VERIO) test strip CHECK BLOOD SUGAR BEFORE BREAKFAST EVERY MORNING   metFORMIN (GLUCOPHAGE) 500 MG tablet Take 1 tablet (500 mg total) by mouth 2 (two) times daily.   sertraline (ZOLOFT) 50 MG tablet Take 1 tablet (50 mg total) by mouth daily.   hydroxypropyl methylcellulose / hypromellose (ISOPTO TEARS / GONIOVISC) 2.5 % ophthalmic solution Place 2 drops into the right eye 4 (four) times daily. (Patient not taking: Reported on 07/21/2023)   [DISCONTINUED] cetirizine (ZYRTEC ALLERGY) 10 MG tablet Take 1 tablet (10 mg total) by mouth daily for 7 days. (Patient not taking: Reported on 08/23/2023)   [DISCONTINUED] predniSONE (DELTASONE) 50 MG tablet Take 1 tablet daily for 7 days (Patient not taking: Reported on 08/23/2023)   No facility-administered encounter medications on file as of  08/23/2023.  :   Review of Systems:  Out of a complete 14 point review of systems, all are reviewed and negative with the exception of these symptoms as listed below:  Review of Systems  Neurological:        Patient is here alone, referred for Bell's Palsy and empty sella. Patient states her Bell's Palsy has partly resolved except she states her ear is bothering her, her vision is blurry in the right eye, and her eye still cannot blink with the other eye. She was prescribed eye drops but they were not in stock and she never got any over the counter. She states  her pcp checked her ear 2 days after she was diagnosed but she has not been back to see him. She was told her whole right side would hurt and she states it did. She has been having migraines since she got COVID in 2021.     Objective:  Neurological Exam  Physical Exam Physical Examination:   Vitals:   08/23/23 0822  BP: (!) 142/94  Pulse: 82    General Examination: The patient is a very pleasant 37 y.o. female in no acute distress. She appears well-developed and well-nourished and well groomed.  Anxious and tearful during the visit but better at the end of the visit.  HEENT: Normocephalic, atraumatic, pupils are equal, round and reactive to light, no significant photophobia, funduscopic exam largely benign with some hazy margins of the disc on the right side.  Corrective eyeglasses in place.  Extraocular tracking is good without limitation to gaze excursion or nystagmus noted. Face is slightly asymmetric with residual weakness in the right forehead and with eye closure but does get almost complete eye closure on the right side.  Tympanic membranes unremarkable bilaterally.  Hearing grossly intact with tuning fork.  Normal facial sensation to light touch, temperature and vibration sense. Speech is clear with no dysarthria noted. There is no hypophonia. There is no lip, neck/head, jaw or voice tremor. Neck is supple with full range of  passive and active motion. There are no carotid bruits on auscultation. Oropharynx exam reveals: No significant mouth dryness.   Chest: Clear to auscultation without wheezing, rhonchi or crackles noted.  Heart: S1+S2+0, regular and normal without murmurs, rubs or gallops noted.   Abdomen: Soft, non-tender and non-distended.  Extremities: There is no pitting edema in the distal lower extremities bilaterally.   Skin: Warm and dry without trophic changes noted.   Musculoskeletal: exam reveals no obvious joint deformities.   Neurologically:  Mental status: The patient is awake, alert and oriented in all 4 spheres. Her immediate and remote memory, attention, language skills and fund of knowledge are appropriate. There is no evidence of aphasia, agnosia, apraxia or anomia. Speech is clear with normal prosody and enunciation. Thought process is linear. Mood is normal and affect is normal.  Cranial nerves II - XII are as described above under HEENT exam.  Motor exam: Normal bulk, strength and tone is noted. There is no obvious action or resting tremor.  No drift or rebound.  No postural tremor. Romberg negative. Fine motor skills and coordination: grossly intact.  Cerebellar testing: No dysmetria or intention tremor. There is no truncal or gait ataxia.  Normal finger-to-nose, normal heel-to-shin bilaterally. Reflexes 1-2+ throughout, toes are downgoing bilaterally. Sensory exam: intact to light touch, temperature and vibration sense in the upper and lower extremities.  Gait, station and balance: She stands easily. No veering to one side is noted. No leaning to one side is noted. Posture is age-appropriate and stance is narrow based. Gait shows normal stride length and normal pace. No problems turning are noted.  Normal tandem walk.  Assessment and Plan:  In summary, JASSMYN VERHULST is a very pleasant 37 y.o.-year old female with an underlying medical history of asthma, hypertension, diabetes,  and severe obesity with a BMI of over 40, who presents for evaluation of her recent Bell's palsy on the right side with slight residual weakness noted but no obvious Bell's phenomenon, almost complete eye closure achieved on the right.  She has improved.  In the context of her recent head CT  she was found to have a partially empty sella.  I explained this finding to her.  She has had intermittent blurry vision on the right side but only since the Bell's palsy which could be related.  She is advised to use over-the-counter eyedrops.  I had a long discussion with the patient today and she was given detailed verbal instructions as well as written instructions in her MyChart which she can access electronically.  Below is a copy of my instructions and discussion points with her today:   << 1. We will do a brain scan, called MRI and call you with the test results. We will have to schedule you for this on a separate date. This test requires authorization from your insurance, and we will take care of the insurance process. 2.  I would like for you to get a formal eye exam.  You can find an optometrist or ophthalmologist of your choosing, please request a full, dilated, eye exam including visual field testing, to see if you have had any loss of peripheral vision.  I can make a referral to ophthalmology if you prefer. I will put in a referral, as discussed.  3. We may consider a LP (lumbar puncture/spinal tap) with pressure testing on your spinal fluid and routine fluid testing. We will call you with the results.  4.  Eventually, if your spinal fluid pressure is indeed elevated, we will have you start a medication called diamox to help keep your spinal fluid pressure at bay.  Some people need more than 1 spinal tap over time. We will consider a medication to keep the fluid pressure lower at some point.  5. Your vision and visual [place] meaning peripheral vision can be affected. The most serious complication of having  pseudotumor cerebri is loss of vision, which can be permanent. Work up, at least initially with the eye specialist should include: best corrected visual acuity, formal visual field testing, dilated fundus examination with optic disc photographs, and often optical coherence tomography (OCT) of the optic nerve, retinal nerve fiber layer, and macular ganglion cell layer. Worsening vision is an indication for intensifying treatment.  6.  Ultimately, some people need to be considered for a shunt in the future (to prevent fluid pressure from building up over and over).  A small tubing will be placed into one of the fluid cavities in your brain to introduce a permanent small drainage.  This is done through neurosurgery. 7. We will also do a sleep study to rule out obstructive sleep apnea.  If you have obstructive sleep apnea, I will want you to start treatment with a machine called CPAP or AutoPAP.  Treatment of obstructive sleep apnea can help headaches, also help with metabolism and weight loss, and ultimately, treatment of sleep apnea can reduce risk for cardiovascular complications including heart disease and stroke risk. 8.  Please work on weight loss. I will make a referral to healthy weight and wellness.  >>  This was an extended visit of over 60 minutes due to addressing multiple problems, significant counseling and coordination of care involved. We will plan to follow-up within the next few months.  I answered all her questions today and she was in agreement with our plan.  Thank you very much for allowing me to participate in the care of this nice patient. If I can be of any further assistance to you please do not hesitate to call me at [phone].  Sincerely,   Huston Foley, MD, PhD

## 2023-08-23 NOTE — Patient Instructions (Addendum)
It was nice to meet you today. You were found to have a partially empty sella on CT scan of your head.  This is typically an incidental finding but can be associated with a condition called idiopathic intracranial hypertension (IIH) aka a condition called pseudotumor cerebri.  This means that there may be an increased fluid pressure around your brain, resulting in pressure on your brain, which can cause headaches, and pressure on the eye nerve(s), which can cause blurry vision, even loss of vision.  I would like to suggest a few things today:   1. We will do a brain scan, called MRI and call you with the test results. We will have to schedule you for this on a separate date. This test requires authorization from your insurance, and we will take care of the insurance process. 2.  I would like for you to get a formal eye exam.  You can find an optometrist or ophthalmologist of your choosing, please request a full, dilated, eye exam including visual field testing, to see if you have had any loss of peripheral vision.  I can make a referral to ophthalmology if you prefer. I will put in a referral, as discussed.  3. We may consider a LP (lumbar puncture/spinal tap) with pressure testing on your spinal fluid and routine fluid testing. We will call you with the results.  4.  Eventually, if your spinal fluid pressure is indeed elevated, we will have you start a medication called diamox to help keep your spinal fluid pressure at bay.  Some people need more than 1 spinal tap over time. We will consider a medication to keep the fluid pressure lower at some point.  5. Your vision and visual [place] meaning peripheral vision can be affected. The most serious complication of having pseudotumor cerebri is loss of vision, which can be permanent. Work up, at least initially with the eye specialist should include: best corrected visual acuity, formal visual field testing, dilated fundus examination with optic disc photographs,  and often optical coherence tomography (OCT) of the optic nerve, retinal nerve fiber layer, and macular ganglion cell layer. Worsening vision is an indication for intensifying treatment.  6.  Ultimately, some people need to be considered for a shunt in the future (to prevent fluid pressure from building up over and over).  A small tubing will be placed into one of the fluid cavities in your brain to introduce a permanent small drainage.  This is done through neurosurgery. 7. We will also do a sleep study to rule out obstructive sleep apnea.  If you have obstructive sleep apnea, I will want you to start treatment with a machine called CPAP or AutoPAP.  Treatment of obstructive sleep apnea can help headaches, also help with metabolism and weight loss, and ultimately, treatment of sleep apnea can reduce risk for cardiovascular complications including heart disease and stroke risk. 8.  Please work on weight loss. I will make a referral to healthy weight and wellness.

## 2023-08-25 ENCOUNTER — Encounter: Payer: Self-pay | Admitting: Dietician

## 2023-08-25 ENCOUNTER — Encounter: Payer: Medicaid Other | Attending: Nurse Practitioner | Admitting: Dietician

## 2023-08-25 DIAGNOSIS — Z713 Dietary counseling and surveillance: Secondary | ICD-10-CM | POA: Insufficient documentation

## 2023-08-25 DIAGNOSIS — E1165 Type 2 diabetes mellitus with hyperglycemia: Secondary | ICD-10-CM

## 2023-08-25 DIAGNOSIS — E119 Type 2 diabetes mellitus without complications: Secondary | ICD-10-CM | POA: Insufficient documentation

## 2023-08-25 NOTE — Patient Instructions (Signed)
Try a Premier Protein shake to have on the go in the morning! Put a note on your coffee maker to grab your shake before leaving the house!  Work on incorporating physical activity more often as you can, especially under periods of high stress.  Work towards eating three meals a day, about 5-6 hours apart!  Begin to recognize carbohydrates, proteins, and non-starchy vegetables in your food choices!  Begin to build your meals using the proportions of the Balanced Plate. First, select your carb choice(s) for the meal. Make this 25% of your meal. Next, select your source of protein to pair with your carb choice(s). Make this another 25% of your meal. Finally, complete your meal with a variety of non-starchy vegetables. Make this the remaining 50% of your meal.
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
## 2023-08-30 ENCOUNTER — Telehealth (INDEPENDENT_AMBULATORY_CARE_PROVIDER_SITE_OTHER): Payer: Medicaid Other | Admitting: Family Medicine

## 2023-08-30 ENCOUNTER — Telehealth: Payer: Self-pay | Admitting: Neurology

## 2023-08-30 ENCOUNTER — Encounter: Payer: Self-pay | Admitting: Family Medicine

## 2023-08-30 VITALS — Ht 68.0 in | Wt 275.0 lb

## 2023-08-30 DIAGNOSIS — J209 Acute bronchitis, unspecified: Secondary | ICD-10-CM | POA: Diagnosis not present

## 2023-08-30 MED ORDER — GUAIFENESIN-CODEINE 100-10 MG/5ML PO SOLN
5.0000 mL | Freq: Four times a day (QID) | ORAL | 0 refills | Status: DC | PRN
Start: 2023-08-30 — End: 2023-10-13

## 2023-08-30 MED ORDER — AZITHROMYCIN 250 MG PO TABS: ORAL_TABLET | ORAL | 0 refills | Status: DC

## 2023-08-30 NOTE — Telephone Encounter (Signed)
Ophthalmology referral faxed [facility] (fax# [phone], phone# [phone])

## 2023-08-30 NOTE — Progress Notes (Signed)
VIRTUAL VISIT A virtual visit is felt to be most appropriate for this patient at this time.   I connected with the patient on 08/30/23 at 10:20 AM EST by virtual telehealth platform and verified that I am speaking with the correct person using two identifiers.   I discussed the limitations, risks, security and privacy concerns of performing an evaluation and management service by  virtual telehealth platform and the availability of in person appointments. I also discussed with the patient that there may be a patient responsible charge related to this service. The patient expressed understanding and agreed to proceed.  Patient location: Home Provider Location: [place] Participants: Kerby Nora and Logan Bores   Chief Complaint  Patient presents with   Cough    Ongoing since last Wednesday. Patient has been taken Nyquil and Dayquil    History of Present Illness:  37 y.o. female patient of Toney Reil, Genene Churn, NP presents with  cough.   Date of onset:  1 week ago Initial symptoms included  scratchy throat, post nasal drip Symptoms progressed to hoarse voice, cough, productive  Some mild SOB... minimal improvement  son's albuterol treatment No new ear pain pain, no face pressure other than Bell's palsy.  Body aches.  Worsening overall.  No fever   Sick contacts:  sone COVID testing:   none     She has tried to treat with  Nyquil and Dayquil     No history of chronic lung disease such as asthma or COPD.  Has DM type 2. Lab Results  Component Value Date   HGBA1C 7.9 (A) 07/14/2023   Non-smoker.  PCN allergy     COVID 19 screen No recent travel or known exposure to COVID19 The patient denies respiratory symptoms of COVID 19 at this time.  The importance of social distancing was discussed today.   Review of Systems  Constitutional:  Negative for chills and fever.  HENT:  Positive for congestion and sore throat. Negative for ear pain.   Eyes:  Negative  for pain and redness.  Respiratory:  Negative for cough and shortness of breath.   Cardiovascular:  Negative for chest pain, palpitations and leg swelling.  Gastrointestinal:  Negative for abdominal pain, blood in stool, constipation, diarrhea, nausea and vomiting.  Genitourinary:  Negative for dysuria.  Musculoskeletal:  Negative for falls and myalgias.  Skin:  Negative for rash.  Neurological:  Negative for dizziness.  Psychiatric/Behavioral:  Negative for depression. The patient is not nervous/anxious.       Past Medical History:  Diagnosis Date   Asthma    Childhood   Diabetes mellitus without complication (HCC)    Hypertension     reports that she has never smoked. She has never used smokeless tobacco. She reports that she does not currently use drugs. She reports that she does not drink alcohol.   Current Outpatient Medications:    azithromycin (ZITHROMAX) 250 MG tablet, 2 tab po x 1 day then 1 tab po daily, Disp: 6 tablet, Rfl: 0   Blood Glucose Monitoring Suppl (ONETOUCH VERIO FLEX SYSTEM) w/Device KIT, CHECK BLOOD SUGAR EVERY MORNING BEFORE BREAKFAST., Disp: , Rfl:    glucose blood (ONETOUCH VERIO) test strip, CHECK BLOOD SUGAR BEFORE BREAKFAST EVERY MORNING, Disp: , Rfl:    guaiFENesin-codeine 100-10 MG/5ML syrup, Take 5 mLs by mouth every 6 (six) hours as needed for cough., Disp: 100 mL, Rfl: 0   metFORMIN (GLUCOPHAGE) 500 MG tablet, Take 1 tablet (500 mg total)  by mouth 2 (two) times daily., Disp: 180 tablet, Rfl: 0   sertraline (ZOLOFT) 50 MG tablet, Take 1 tablet (50 mg total) by mouth daily., Disp: 90 tablet, Rfl: 0   Observations/Objective: Height 5\' 8"  (1.727 m), weight 275 lb (124.7 kg).  Physical Exam Constitutional:      General: The patient is not in acute distress. Pulmonary:     Effort: Pulmonary effort is normal. No respiratory distress.  Neurological:     Mental Status: The patient is alert and oriented to person, place, and time.  Psychiatric:         Mood and Affect: Mood normal.        Behavior: Behavior normal.    Assessment and Plan Acute bronchitis, unspecified organism Assessment & Plan: Acute, likely initial viral infection now with possible bacterial superinfection given ongoing symptoms greater than 7 to 10 days. Recommended Flonase 2 sprays per nostril daily for nasal congestion.  Can use prescription cough suppressant at night.  Will treat with azithromycin 5-day course.  Given shortness of breath and diabetes history if not improving as expected over the next 2 to 3 days, recommended in person exam.  Go to the emergency room if severe shortness of breath.   Other orders -     Azithromycin; 2 tab po x 1 day then 1 tab po daily  Dispense: 6 tablet; Refill: 0 -     guaiFENesin-Codeine; Take 5 mLs by mouth every 6 (six) hours as needed for cough.  Dispense: 100 mL; Refill: 0      I discussed the assessment and treatment plan with the patient. The patient was provided an opportunity to ask questions and all were answered. The patient agreed with the plan and demonstrated an understanding of the instructions.   The patient was advised to call back or seek an in-person evaluation if the symptoms worsen or if the condition fails to improve as anticipated.     Kerby Nora, MD

## 2023-08-30 NOTE — Telephone Encounter (Signed)
[facility] Emily Blevins: [id number] exp. 08/30/23-10/29/23 sent to GI [phone]

## 2023-08-30 NOTE — Assessment & Plan Note (Signed)
Acute, likely initial viral infection now with possible bacterial superinfection given ongoing symptoms greater than 7 to 10 days. Recommended Flonase 2 sprays per nostril daily for nasal congestion.  Can use prescription cough suppressant at night.  Will treat with azithromycin 5-day course.  Given shortness of breath and diabetes history if not improving as expected over the next 2 to 3 days, recommended in person exam.  Go to the emergency room if severe shortness of breath.

## 2023-09-05 DIAGNOSIS — G510 Bell's palsy: Secondary | ICD-10-CM | POA: Diagnosis not present

## 2023-09-05 DIAGNOSIS — E236 Other disorders of pituitary gland: Secondary | ICD-10-CM | POA: Diagnosis not present

## 2023-09-05 LAB — HM DIABETES EYE EXAM

## 2023-09-07 DIAGNOSIS — H5213 Myopia, bilateral: Secondary | ICD-10-CM | POA: Diagnosis not present

## 2023-09-07 NOTE — Telephone Encounter (Signed)
Received OV note from Dr Dione Booze. Note placed in Dr Teofilo Pod office.

## 2023-09-15 ENCOUNTER — Encounter: Payer: Self-pay | Admitting: Neurology

## 2023-09-20 DIAGNOSIS — Z419 Encounter for procedure for purposes other than remedying health state, unspecified: Secondary | ICD-10-CM | POA: Diagnosis not present

## 2023-09-22 ENCOUNTER — Ambulatory Visit
Admission: RE | Admit: 2023-09-22 | Discharge: 2023-09-22 | Disposition: A | Discharge status: Home / Self Care | Payer: Medicaid Other | Source: Ambulatory Visit | Attending: Neurology | Admitting: Neurology

## 2023-09-22 DIAGNOSIS — H538 Other visual disturbances: Secondary | ICD-10-CM | POA: Diagnosis not present

## 2023-09-22 DIAGNOSIS — R030 Elevated blood-pressure reading, without diagnosis of hypertension: Secondary | ICD-10-CM

## 2023-09-22 DIAGNOSIS — E236 Other disorders of pituitary gland: Secondary | ICD-10-CM | POA: Diagnosis not present

## 2023-09-22 DIAGNOSIS — F419 Anxiety disorder, unspecified: Secondary | ICD-10-CM | POA: Diagnosis not present

## 2023-09-22 DIAGNOSIS — R519 Headache, unspecified: Secondary | ICD-10-CM

## 2023-09-22 DIAGNOSIS — G510 Bell's palsy: Secondary | ICD-10-CM | POA: Diagnosis not present

## 2023-09-22 DIAGNOSIS — Z9189 Other specified personal risk factors, not elsewhere classified: Secondary | ICD-10-CM | POA: Diagnosis not present

## 2023-09-22 MED ORDER — GADOPICLENOL 0.5 MMOL/ML IV SOLN
10.0000 mL | Freq: Once | INTRAVENOUS | Status: AC | PRN
  Administered 2023-09-22: 10 mL via INTRAVENOUS

## 2023-10-13 ENCOUNTER — Ambulatory Visit (INDEPENDENT_AMBULATORY_CARE_PROVIDER_SITE_OTHER): Payer: Medicaid Other | Admitting: Nurse Practitioner

## 2023-10-13 ENCOUNTER — Encounter: Payer: Self-pay | Admitting: Nurse Practitioner

## 2023-10-13 VITALS — BP 124/76 | HR 77 | Temp 97.9°F | Ht 67.75 in | Wt 275.4 lb

## 2023-10-13 DIAGNOSIS — Z7984 Long term (current) use of oral hypoglycemic drugs: Secondary | ICD-10-CM

## 2023-10-13 DIAGNOSIS — Z6841 Body Mass Index (BMI) 40.0 and over, adult: Secondary | ICD-10-CM | POA: Diagnosis not present

## 2023-10-13 DIAGNOSIS — F411 Generalized anxiety disorder: Secondary | ICD-10-CM

## 2023-10-13 DIAGNOSIS — E1165 Type 2 diabetes mellitus with hyperglycemia: Secondary | ICD-10-CM | POA: Diagnosis not present

## 2023-10-13 DIAGNOSIS — Z0000 Encounter for general adult medical examination without abnormal findings: Secondary | ICD-10-CM | POA: Diagnosis not present

## 2023-10-13 LAB — LIPID PANEL
Cholesterol: 176 mg/dL (ref 0–200)
HDL: 42.8 mg/dL (ref >39.00)
LDL Cholesterol: 77 mg/dL (ref 0–99)
NonHDL: 133.08
Total CHOL/HDL Ratio: 4
Triglycerides: 280 mg/dL — ABNORMAL HIGH (ref 0.0–149.0)
VLDL: 56 mg/dL — ABNORMAL HIGH (ref 0.0–40.0)

## 2023-10-13 LAB — COMPREHENSIVE METABOLIC PANEL
ALT: 24 U/L (ref 0–35)
AST: 15 U/L (ref 0–37)
Albumin: 3.9 g/dL (ref 3.5–5.2)
Alkaline Phosphatase: 53 U/L (ref 39–117)
BUN: 13 mg/dL (ref 6–23)
CO2: 27 meq/L (ref 19–32)
Calcium: 8.7 mg/dL (ref 8.4–10.5)
Chloride: 101 meq/L (ref 96–112)
Creatinine, Ser: 0.65 mg/dL (ref 0.40–1.20)
GFR: 111.44 mL/min (ref >60.00)
Glucose, Bld: 273 mg/dL — ABNORMAL HIGH (ref 70–99)
Potassium: 4.5 meq/L (ref 3.5–5.1)
Sodium: 135 meq/L (ref 135–145)
Total Bilirubin: 0.5 mg/dL (ref 0.2–1.2)
Total Protein: 6.1 g/dL (ref 6.0–8.3)

## 2023-10-13 LAB — CBC
HCT: 39.8 % (ref 36.0–46.0)
Hemoglobin: 12.9 g/dL (ref 12.0–15.0)
MCHC: 32.5 g/dL (ref 30.0–36.0)
MCV: 78.8 fL (ref 78.0–100.0)
Platelets: 262 10*3/uL (ref 150.0–400.0)
RBC: 5.04 Mil/uL (ref 3.87–5.11)
RDW: 14.7 % (ref 11.5–15.5)
WBC: 8.4 10*3/uL (ref 4.0–10.5)

## 2023-10-13 LAB — MICROALBUMIN / CREATININE URINE RATIO
Creatinine,U: 133.3 mg/dL
Microalb Creat Ratio: 0.9 mg/g (ref 0.0–30.0)
Microalb, Ur: 1.2 mg/dL (ref 0.0–1.9)

## 2023-10-13 LAB — TSH: TSH: 4.08 u[IU]/mL (ref 0.35–5.50)

## 2023-10-13 LAB — HEMOGLOBIN A1C: Hgb A1c MFr Bld: 11.1 % — ABNORMAL HIGH (ref 4.6–6.5)

## 2023-10-13 MED ORDER — SERTRALINE HCL 100 MG PO TABS: 100.0000 mg | ORAL_TABLET | Freq: Every day | ORAL | 0 refills | Status: DC

## 2023-10-13 NOTE — Progress Notes (Addendum)
Established Patient Office Visit  Subjective   Patient ID: Emily Blevins, female    DOB: [birth year]  Age: 39 y.o. MRN: [id number]  Chief Complaint  Patient presents with   Annual Exam   Emily Blevins is here for complete physical and follow up of chronic conditions.   Elevated BP: Pt is not on any medications as she only had a single occurrence during an acute illness. Reports that she does not check it at home. Does report HA every day without photophobia, but has some blurred vision.    DM2: Pt taking metformin 500 mg BID as precribed. Pt used to check her glucose everyday, but lost her meter 2 months ago. Pt reports that she is always thirsty, denies polyuria, denies numbness or tingling in her feet. Reports she normally can tell her glucose is high when her lips tingle, but she now has that constantly since having Bell's Palsy  GAD: Pt taking Zoloft 50 mg daily as prescribed. Reports anxiety is getting worse. Starts crying without provocation on a daily basis. Pt is open to therapy at this juncture.   Immunizations: -Tetanus: Completed in 10/12/2023 -Influenza: Declines annual -COVID: Declines annual -Shingles: Not indicated -Pneumonia: Declines at this   Diet: Fair diet.Eats 2 meals a day with intermittent fasting. Recently saw a dietician and is now trying to eat 5 small meals a day and is doing it 20% of the time. Pt does snack. Drinks 1 500 mL bottle day, coffee, unsweet tea.   Exercise: No regular exercise. 10,000 steps per day while at work. Plays pickelball and tennis on the weekend for 2 hours.    Eye exam: Completes annually, last performed 08/2023 Dental exam: Not up to date  Colonoscopy: Not indicated Lung Cancer Screening: Not indicated  Mammo: Not done Pap: Declines todays Dexa: Not indicated     HPI    Review of Systems  Constitutional:  Negative for chills, fever and weight loss.  HENT:  Negative for hearing loss and tinnitus.   Eyes:  Positive  for blurred vision.  Respiratory:  Negative for shortness of breath.   Cardiovascular:  Negative for chest pain and palpitations.  Gastrointestinal:  Negative for abdominal pain.       BM daily  Genitourinary:  Negative for frequency.  Musculoskeletal:  Positive for joint pain.       Knees  Skin:  Negative for rash.  Neurological:  Positive for dizziness, tingling and headaches.  Psychiatric/Behavioral:  Negative for suicidal ideas. The patient is nervous/anxious.       Objective:     BP 124/76 (BP Location: Right Arm, Patient Position: Sitting, Cuff Size: Large)   Pulse 77   Temp 97.9 F (36.6 C) (Oral)   Ht 5' 7.75" (1.721 m)   Wt 124.9 kg   LMP 09/29/2023   SpO2 99%   BMI 42.18 kg/m  BP Readings from Last 3 Encounters:  10/13/23 124/76  08/23/23 (!) 142/94  07/21/23 130/74   Wt Readings from Last 3 Encounters:  10/13/23 124.9 kg  08/30/23 124.7 kg  08/23/23 124.3 kg   SpO2 Readings from Last 3 Encounters:  10/13/23 99%  07/21/23 98%  07/17/23 100%      Physical Exam Vitals reviewed.  Constitutional:      Appearance: Normal appearance. She is normal weight.  HENT:     Right Ear: Tympanic membrane, ear canal and external ear normal.     Left Ear: Tympanic membrane, ear canal and external ear normal.  Nose: Nose normal.     Mouth/Throat:     Mouth: Mucous membranes are moist.  Eyes:     Pupils: Pupils are equal, round, and reactive to light.  Neck:     Thyroid: No thyroid mass, thyromegaly or thyroid tenderness.  Cardiovascular:     Rate and Rhythm: Normal rate and regular rhythm.     Pulses: Normal pulses.          Posterior tibial pulses are 2+ on the right side and 2+ on the left side.     Heart sounds: Normal heart sounds. No murmur heard.    No friction rub. No gallop.  Pulmonary:     Effort: Pulmonary effort is normal.     Breath sounds: Normal breath sounds.  Abdominal:     Palpations: Abdomen is soft.     Tenderness: There is no  abdominal tenderness.  Musculoskeletal:     Cervical back: Neck supple. No tenderness.  Feet:     Right foot:     Skin integrity: Skin integrity normal.     Toenail Condition: Right toenails are normal.     Left foot:     Skin integrity: Skin integrity normal.     Toenail Condition: Left toenails are normal.  Lymphadenopathy:     Cervical: No cervical adenopathy.  Skin:    General: Skin is warm and dry.  Neurological:     Mental Status: She is alert and oriented to person, place, and time.     Comments: Subtle flattening of L nasolabial fold. Bilateral upper and lower extremities 5/5.  Psychiatric:        Mood and Affect: Mood normal.        Behavior: Behavior normal.    Title   Diabetic Foot Exam - detailed Is there a history of foot ulcer?: No Is there a foot ulcer now?: No Is there swelling?: No Is there elevated skin temperature?: No Is there abnormal foot shape?: No Is there a claw toe deformity?: No Are the toenails long?: No Are the toenails thick?: No Are the toenails ingrown?: No Right Posterior Tibialis: Present Left posterior Tibialis: Present      Semmes-Weinstein Monofilament Test "+" means "has sensation" and "-" means "no sensation"      Image components are not supported.   Image components are not supported. Image components are not supported.  [place] Comments L Foot - sensation absent 8, 6, 3  R Foot - all sensation sites intact       No results found for any visits on 10/13/23.    The ASCVD Risk score (Arnett DK, et al., 2019) failed to calculate for the following reasons:   The 2019 ASCVD risk score is only valid for ages 58 to 48    Assessment & Plan:   Problem List Items Addressed This Visit       Endocrine   Type 2 diabetes mellitus with hyperglycemia, without long-term current use of insulin (HCC)   Continue metformin 500 mg BID. Encouraged healthy lifestyle modifications, low carb diet, and periodic checks of glucose.   Labs pending      Relevant Orders   CBC   Comprehensive metabolic panel   Lipid Panel   Hemoglobin A1c   Microalbumin / creatinine urine ratio     Other   GAD (generalized anxiety disorder)   Pt taking zoloft 50 mg daily. Given the worsening of symptoms pt referred to psychology for therapy and Zoloft increased to 100 mg  daily. Encouraged stress reduction exercises.       Relevant Medications   sertraline (ZOLOFT) 100 MG tablet   Other Relevant Orders   Ambulatory referral to Psychology   Morbid obesity (HCC)   Labs pending.       Relevant Orders   TSH   Preventative health care - Primary   Discussed age-appropriate immunizations and screening exams. Did review pt's PMHx, Sx, family, social history. Labs pending. Pt was given information at discharge about preventative health maintenance and anticipatory guidance.         Return in about 6 weeks (around 11/24/2023) for GAD/Med Recheck.    Murvin Donning, RN

## 2023-10-13 NOTE — Patient Instructions (Addendum)
It was a pleasure to see you today.  I recommend getting a BP cuff and check your BP once weekly at home.  We will check you A1C and labs today and will notify you of the results.  Your Zoloft dose has been increased to 100 mg daily and has been sent to your pharmacy.  Please follow up with [place] in 6 weeks for you anxiety.

## 2023-10-13 NOTE — Assessment & Plan Note (Addendum)
Labs pending.   I evaluated patient, was consulted regarding treatment, and agree with assessment and plan per Denice Bors RN, FNP Student   Audria Nine, DNP, AGNP-C

## 2023-10-13 NOTE — Assessment & Plan Note (Addendum)
Discussed age-appropriate immunizations and screening exams. Did review pt's PMHx, Sx, family, social history. Labs pending. Pt was given information at discharge about preventative health maintenance and anticipatory guidance.    I evaluated patient, was consulted regarding treatment, and agree with assessment and plan per Denice Bors RN, FNP Student   Audria Nine, DNP, AGNP-C

## 2023-10-13 NOTE — Assessment & Plan Note (Addendum)
Continue metformin 500 mg BID. Encouraged healthy lifestyle modifications, low carb diet, and periodic checks of glucose.  Labs pending.  I evaluated patient, was consulted regarding treatment, and agree with assessment and plan per Denice Bors RN, FNP Student   Audria Nine, DNP, AGNP-C

## 2023-10-13 NOTE — Assessment & Plan Note (Addendum)
Pt taking zoloft 50 mg daily. Given the worsening of symptoms pt referred to psychology for therapy and Zoloft increased to 100 mg daily. Encouraged stress reduction exercises.   I evaluated patient, was consulted regarding treatment, and agree with assessment and plan per Denice Bors RN, FNP Student   Audria Nine, DNP, AGNP-C

## 2023-10-16 ENCOUNTER — Encounter: Payer: Self-pay | Admitting: Nurse Practitioner

## 2023-10-16 DIAGNOSIS — E1165 Type 2 diabetes mellitus with hyperglycemia: Secondary | ICD-10-CM

## 2023-10-16 MED ORDER — TRULICITY 0.75 MG/0.5ML ~~LOC~~ SOAJ: 0.7500 mg | SUBCUTANEOUS | 0 refills | Status: DC

## 2023-10-17 ENCOUNTER — Ambulatory Visit: Payer: Self-pay | Admitting: Nurse Practitioner

## 2023-10-17 ENCOUNTER — Other Ambulatory Visit (HOSPITAL_COMMUNITY): Payer: Self-pay

## 2023-10-17 ENCOUNTER — Telehealth: Payer: Self-pay | Admitting: Pharmacy Technician

## 2023-10-17 ENCOUNTER — Other Ambulatory Visit: Payer: Self-pay

## 2023-10-17 ENCOUNTER — Encounter: Payer: Self-pay | Admitting: Emergency Medicine

## 2023-10-17 ENCOUNTER — Emergency Department
Admission: EM | Admit: 2023-10-17 | Discharge: 2023-10-17 | Discharge status: Against Medical Advice | Payer: Medicaid Other | Attending: Emergency Medicine | Admitting: Emergency Medicine

## 2023-10-17 DIAGNOSIS — Z5321 Procedure and treatment not carried out due to patient leaving prior to being seen by health care provider: Secondary | ICD-10-CM | POA: Diagnosis not present

## 2023-10-17 DIAGNOSIS — R739 Hyperglycemia, unspecified: Secondary | ICD-10-CM | POA: Diagnosis not present

## 2023-10-17 LAB — CBC
HCT: 35.5 % — ABNORMAL LOW (ref 36.0–46.0)
Hemoglobin: 11.8 g/dL — ABNORMAL LOW (ref 12.0–15.0)
MCH: 26 pg (ref 26.0–34.0)
MCHC: 33.2 g/dL (ref 30.0–36.0)
MCV: 78.2 fL — ABNORMAL LOW (ref 80.0–100.0)
Platelets: 277 10*3/uL (ref 150–400)
RBC: 4.54 MIL/uL (ref 3.87–5.11)
RDW: 13.5 % (ref 11.5–15.5)
WBC: 8.8 10*3/uL (ref 4.0–10.5)
nRBC: 0 % (ref 0.0–0.2)

## 2023-10-17 LAB — COMPREHENSIVE METABOLIC PANEL
ALT: 31 U/L (ref 0–44)
AST: 25 U/L (ref 15–41)
Albumin: 3.9 g/dL (ref 3.5–5.0)
Alkaline Phosphatase: 55 U/L (ref 38–126)
Anion gap: 15 (ref 5–15)
BUN: 18 mg/dL (ref 6–20)
CO2: 22 mmol/L (ref 22–32)
Calcium: 9.3 mg/dL (ref 8.9–10.3)
Chloride: 99 mmol/L (ref 98–111)
Creatinine, Ser: 0.8 mg/dL (ref 0.44–1.00)
GFR, Estimated: >60 mL/min (ref >60)
Glucose, Bld: 317 mg/dL — ABNORMAL HIGH (ref 70–99)
Potassium: 3.7 mmol/L (ref 3.5–5.1)
Sodium: 136 mmol/L (ref 135–145)
Total Bilirubin: 0.5 mg/dL (ref 0.0–1.2)
Total Protein: 7.1 g/dL (ref 6.5–8.1)

## 2023-10-17 LAB — CBG MONITORING, ED: Glucose-Capillary: 337 mg/dL — ABNORMAL HIGH (ref 70–99)

## 2023-10-17 NOTE — Telephone Encounter (Signed)
Pharmacy Patient Advocate Encounter  Received notification from [facility] Medicaid that Prior Authorization for Trulicity 0.75MG /0.5ML auto-injectors  has been APPROVED from 10/03/2023 to 10/13/2024. Unable to obtain price due to refill too soon rejection, last fill date 10/17/2023 next available fill date 11/06/2023   PA #/Case ID/Reference #: [id number]

## 2023-10-17 NOTE — ED Triage Notes (Signed)
Patient ambulatory to triage with steady gait, without difficulty or distress noted; pt reports her FSBS at home >600 on Saturday; currently taking metformin as her PCP d/c her insulin; st last FSBS 468; denies any c/o

## 2023-10-17 NOTE — Telephone Encounter (Signed)
Agree with ER recommendation

## 2023-10-17 NOTE — Telephone Encounter (Signed)
Copied From CRM [id number]. Reason for Triage: Patient states her blood sugar was 558 last time she checked at 12pm - Patient states yesterday she was experiencing feeling sick, energy was gone. Today she is feeling better but she can tell its still high because her mouth is dry.   Chief Complaint: High blood glucose Symptoms: Fatigue and feels thirsty Frequency: Since Saturday Pertinent Negatives: Patient denies fever, frequent urination, difficulty breathing, vomiting, weakness, and dizziness Disposition: [] ED /[] Urgent Care (no appt availability in office) / [] Appointment(In office/virtual)/ []  [facility]/ [] Home Care/ [] Refused Recommended Disposition /[] [place] [facility]/ [x]  Follow-up with PCP Additional Notes: Patient called in to report elevated blood glucose levels. Patient measured the following glucose levels: 473 (this morning), 456 (around lunch), and 466 (this afternoon). Patient stated that her blood glucose meter broke and she replaced in on Saturday. Patient stated she started noticing the high blood glucose readings on Saturday. Patient stated that she is experiencing fatigue and feels thirsty. Patient denies fever, frequent urination, difficulty breathing, vomiting, weakness, and dizziness. Patient stated that her sugars normally ran in the 200s, when she was taking Ozempic. Patient stated that she was seen in the office on Friday of last week and she is no longer being prescribed Ozempic. Patient stated she is currently only taking metformin and has yet to pick up the Trulicity. Per disposition, this RN called the CAL. This RN warm transferred the patient to an RN at the clinic for further assessment.   Reason for Disposition  Blood glucose > 400 mg/dL (19.1 mmol/L)  Answer Assessment - Initial Assessment Questions 1. BLOOD GLUCOSE: "What is your blood glucose level?"      473 at 8:22 am, 456 at 11:22 pm, 466 at 3:44 pm  2. ONSET: "When did you check the blood  glucose?"     Started checking sugars on Saturday 3. USUAL RANGE: "What is your glucose level usually?" (e.g., usual fasting morning value, usual evening value)     States in the 200s while on Ozempic, states she has had periods where sugars have gotten into the 500s and she went to the hospital for insulin 4. KETONES: "Do you check for ketones (urine or blood test strips)?" If Yes, ask: "What does the test show now?"      Denies 5. TYPE 1 or 2:  "Do you know what type of diabetes you have?"  (e.g., Type 1, Type 2, Gestational; doesn't know)      Type 2 6. INSULIN: "Do you take insulin?" "What type of insulin(s) do you use? What is the mode of delivery? (syringe, pen; injection or pump)?"      Denies 7. DIABETES PILLS: "Do you take any pills for your diabetes?" If Yes, ask: "Have you missed taking any pills recently?"     500 mg metformin  8. OTHER SYMPTOMS: "Do you have any symptoms?" (e.g., fever, frequent urination, difficulty breathing, dizziness, weakness, vomiting)     Denies fever, denies frequent urination, denies difficulty breathing, denies vomiting, denies weakness, denies dizziness, low energy and thirsty  Protocols used: Diabetes - High Blood Sugar-A-AH

## 2023-10-17 NOTE — Telephone Encounter (Signed)
Pharmacy Patient Advocate Encounter   Received notification from CoverMyMeds that prior authorization for Trulicity 0.75MG /0.5ML auto-injectors is required/requested.   Insurance verification completed.   The patient is insured through Emily Blev[facility] [place] .   Per test claim: PA required; PA submitted to above mentioned insurance via CoverMyMeds Key/confirmation #/EOC [id number] Status is pending

## 2023-10-17 NOTE — Telephone Encounter (Signed)
I spoke with pt who said ;she had not been taking BS since Thanksgiving; pt saw Audria Nine NP on 10/13/23 and ozempic was stopped due to N&V and abd pain and cramping. Pt taking metformin 500 mg bid and has not missed taking med. Pt said on 10/16/23 and 10/17/23 pt has been drinking a lot of water but due to nausea pt had chicken broth for breakfast both days and on 10/16/23 at [employer] for lunch and 10/17/23 at [employer] for lunch; pt said that is all she has eaten in 2 days.  On 10/14/23 BS was greater than 600 x 2 on 10/16/23 each reading was greater than 500 and today FBS was 449 reck was 473 and at 3:30 pm BS was 456.  Pt said she is urinating normally but urine is cloudy; pt is very thirst but has dry mouth; pt feels really tired and drained. Has had dizziness and H/ A on and off and blurred vision mostly at night.  On 10/13/23 at [facility] FBS was 273. Pt is presently at work; pt declines calling 911; pt husband will pick her up and take to [facility] ED. If pt condition worsens pt will call 911. Pt will call on 10/18/23 with update to [facility]; sending to Audria Nine NP who is out of office and Dr Reece Agar who is in office and Amy front office mgr request note sent to her. I did ask Engineer, mining what disposition was and Denny Peon said if BS less than 500 disposition is to call PCP. If over 500 BS disposition would be to go to ED.

## 2023-10-18 NOTE — Telephone Encounter (Signed)
Noted - agree with disposition

## 2023-10-20 ENCOUNTER — Ambulatory Visit: Payer: Medicaid Other | Admitting: Family

## 2023-10-21 DIAGNOSIS — Z419 Encounter for procedure for purposes other than remedying health state, unspecified: Secondary | ICD-10-CM | POA: Diagnosis not present

## 2023-10-23 DIAGNOSIS — H5213 Myopia, bilateral: Secondary | ICD-10-CM | POA: Diagnosis not present

## 2023-10-27 ENCOUNTER — Encounter: Payer: Self-pay | Admitting: Dietician

## 2023-10-27 ENCOUNTER — Encounter: Payer: Medicaid Other | Attending: Nurse Practitioner | Admitting: Dietician

## 2023-10-27 DIAGNOSIS — E1165 Type 2 diabetes mellitus with hyperglycemia: Secondary | ICD-10-CM | POA: Insufficient documentation

## 2023-10-27 NOTE — Patient Instructions (Addendum)
 Prioritize making your protein shake every morning to drink throughout the day. Try to eat some food at every dinner meal, and have a few sips of your shake after if tolerable!  Remind yourself that certain tasks can wait until later!!!  Have a small spoon of peanut butter as a snack during the day!  Drink only between 64-70 oz. of water daily to allow more space for your food!!  If you feel weak or dizzy, check your blood sugar! If it is low (~100 mg/dL), have a small glass of regular soda or fruit juice.

## 2023-10-27 NOTE — Progress Notes (Signed)
 Diabetes Self-Management Education  Visit Type: Follow-up  Appt. Start Time: 0850 Appt. End Time: 0935  10/27/2023  [name], identified by name and date of birth, is a 38 y.o. female with a diagnosis of Diabetes:  .   ASSESSMENT Pt reports a significant increase in A1c since last visit, was started on Trulicity  2 weeks ago, previously tried Ozempic but stopped d/t GI pain, continuing metformin  BID. Pt reports suppressed appetite, nausea with Trulicity , not eating breakfast and lunch now d/t nausea and only having small dinner. Pt reports weight loss of 10 lbs. in 2 weeks. Pt reports also recently getting over flu which contributed to poor appetite. Pt reports checking BG once daily at night, seeing glucose values below 150 most nights, pt reports feeling of dizziness at 111 mg/dL.  Pt reports having very little time to prepare any food in the morning, too busy getting kids ready, feeding and letting out dogs, laundry, showering, making their bed. Pt reports being a busy body, always has to have something to do, states they have recently tried to relax a little more, using Friday as a chill day. Pt reports previously making a protein shake (8 oz almond milk, spinach, protein powder, spoonful of yogurt) for breakfast, but hasn't been making it lately. Pt reports drinking a lot (~128 oz) of water daily   Diabetes Self-Management Education - 10/27/23 1117       Visit Information   Visit Type Follow-up      Pre-Education Assessment   Patient understands the diabetes disease and treatment process. Needs Review    Patient understands incorporating nutritional management into lifestyle. Needs Review    Patient undertands incorporating physical activity into lifestyle. Needs Review    Patient understands using medications safely. Comprehends key points    Patient understands monitoring blood glucose, interpreting and using results Comprehends key points    Patient understands  prevention, detection, and treatment of acute complications. Needs Review    Patient understands prevention, detection, and treatment of chronic complications. Needs Review    Patient understands how to develop strategies to address psychosocial issues. Needs Instruction    Patient understands how to develop strategies to promote health/change behavior. Needs Instruction      Complications   Last HgB A1C per patient/outside source 11.1 %   10/13/2023   How often do you check your blood sugar? 1-2 times/day    Postprandial Blood glucose range (mg/dL) 869-820;819-799      Dietary Intake   Snack (morning) 2 crackers,    Dinner a few bites of Vegetable beef soup    Beverage(s) water      Activity / Exercise   Activity / Exercise Type ADL's    How many days per week do you exercise? 0    How many minutes per day do you exercise? 0    Total minutes per week of exercise 0      Patient Education   Healthy Eating Meal timing in regards to the patients' current diabetes medication.    Medications Taught/reviewed insulin /injectables, injection, site rotation, insulin /injectables storage and needle disposal.;Reviewed patients medication for diabetes, action, purpose, timing of dose and side effects.    Monitoring Purpose and frequency of SMBG.;Identified appropriate SMBG and/or A1C goals.    Acute complications Taught prevention, symptoms, and  treatment of hypoglycemia - the 15 rule.    Chronic complications Relationship between chronic complications and blood glucose control    Diabetes Stress and Support Role of stress on diabetes;Worked  with patient to identify barriers to care and solutions;Helped patient identify a support system for diabetes management    Lifestyle and Health Coping Lifestyle issues that need to be addressed for better diabetes care   Busy body, Prioritizing others first     Individualized Goals (developed by patient)   Nutrition Follow meal plan discussed;General  guidelines for healthy choices and portions discussed    Medications take my medication as prescribed    Monitoring  Test my blood glucose as discussed    Problem Solving Eating Pattern;Addressing barriers to behavior change    Reducing Risk examine blood glucose patterns    Health Coping Ask for help with psychological, social, or emotional issues;Discuss barriers to diabetes care with support person/system (comment specifics as needed)   Ask spouse for support with home/kids     Patient Self-Evaluation of Goals - Patient rates self as meeting previously set goals (% of time)   Nutrition 25 - 50% (sometimes)    Physical Activity < 25% (hardly ever/never)    Medications >75% (most of the time)    Monitoring >75% (most of the time)    Problem Solving and behavior change strategies  25 - 50% (sometimes)    Reducing Risk (treating acute and chronic complications) 25 - 50% (sometimes)    Health Coping < 25% (hardly ever/never)      Post-Education Assessment   Patient understands the diabetes disease and treatment process. Comprehends key points    Patient understands incorporating nutritional management into lifestyle. Needs Review    Patient undertands incorporating physical activity into lifestyle. Needs Review    Patient understands using medications safely. Needs Review    Patient understands monitoring blood glucose, interpreting and using results Comprehends key points    Patient understands prevention, detection, and treatment of acute complications. Comprehends key points    Patient understands prevention, detection, and treatment of chronic complications. Comprehends key points    Patient understands how to develop strategies to address psychosocial issues. Needs Review    Patient understands how to develop strategies to promote health/change behavior. Needs Review      Outcomes   Expected Outcomes Demonstrated interest in learning but significant barriers to change    Future DMSE  4-6 wks    Program Status Not Completed      Subsequent Visit   Since your last visit have you continued or begun to take your medications as prescribed? Yes    Since your last visit have you had your blood pressure checked? Yes    Is your most recent blood pressure lower, unchanged, or higher since your last visit? Higher    Since your last visit have you experienced any weight changes? Loss    Weight Loss (lbs) 10    Since your last visit, are you checking your blood glucose at least once a day? Yes             Individualized Plan for Diabetes Self-Management Training:   Learning Objective:  Patient will have a greater understanding of diabetes self-management. Patient education plan is to attend individual and/or group sessions per assessed needs and concerns.   Plan:   Patient Instructions  Prioritize making your protein shake every morning to drink throughout the day. Try to eat some food at every dinner meal, and have a few sips of your shake after if tolerable!  Remind yourself that certain tasks can wait until later!!!  Have a small spoon of peanut butter as a snack during  the day!  Drink only between 64-70 oz. of water daily to allow more space for your food!!  If you feel weak or dizzy, check your blood sugar! If it is low (~100 mg/dL), have a small glass of regular soda or fruit juice.  Expected Outcomes:  Demonstrated interest in learning but significant barriers to change  If problems or questions, patient to contact team via:  Phone and Email  Future DSME appointment: 4-6 wks

## 2023-11-08 ENCOUNTER — Other Ambulatory Visit: Payer: Self-pay | Admitting: Nurse Practitioner

## 2023-11-08 DIAGNOSIS — F411 Generalized anxiety disorder: Secondary | ICD-10-CM

## 2023-11-10 ENCOUNTER — Other Ambulatory Visit: Payer: Self-pay | Admitting: Nurse Practitioner

## 2023-11-10 DIAGNOSIS — E1165 Type 2 diabetes mellitus with hyperglycemia: Secondary | ICD-10-CM

## 2023-11-10 MED ORDER — TRULICITY 1.5 MG/0.5ML ~~LOC~~ SOAJ: 1.5000 mg | SUBCUTANEOUS | 1 refills | Status: DC

## 2023-11-10 MED ORDER — METFORMIN HCL 500 MG PO TABS
500.0000 mg | ORAL_TABLET | Freq: Two times a day (BID) | ORAL | 0 refills | Status: DC
Start: 2023-11-10 — End: 2024-02-13

## 2023-11-10 NOTE — Telephone Encounter (Signed)
Copied from CRM [id number]. Topic: Clinical - Medication Refill >> Nov 10, 2023  3:37 PM Turkey A wrote: Most Recent Primary Care Visit:  Provider: Eden Emms  Department: [facility]  Visit Type: PHYSICAL  Date: 10/13/2023  Medication: Dulaglutide (TRULICITY) 0.75 MG/0.5ML SOAJ;metFORMIN (GLUCOPHAGE) 500 MG tablet  Has the patient contacted their pharmacy? Yes (Agent: If no, request that the patient contact the pharmacy for the refill. If patient does not wish to contact the pharmacy document the reason why and proceed with request.) (Agent: If yes, when and what did the pharmacy advise?)  Is this the correct pharmacy for this prescription? Yes If no, delete pharmacy and type the correct one.  This is the patient's preferred pharmacy:  [phone] [facility] [place] - [address] [place] [address] Phone: [phone] Fax: [phone]   Has the prescription been filled recently? No  Is the patient out of the medication? Yes  Has the patient been seen for an appointment in the last year OR does the patient have an upcoming appointment? Yes  Can we respond through MyChart? Yes  Agent: Please be advised that Rx refills may take up to 3 business days. We ask that you follow-up with your pharmacy.

## 2023-11-14 ENCOUNTER — Telehealth: Payer: Self-pay | Admitting: Nurse Practitioner

## 2023-11-14 NOTE — Telephone Encounter (Signed)
 Copied from CRM [id number]. Topic: General - Other >> Nov 14, 2023  3:50 PM Almira Coaster wrote: Reason for CRM: Patient is calling because she recently became responsible for a 39 year old. She is in the process of becoming a legal guardian but has no paper work as of yet. She would like to know what are the steps she can take in order to the child become part of the practice and see a pcp at our facility. Best call back number is [phone].

## 2023-11-14 NOTE — Telephone Encounter (Signed)
 Cable pool disregard message. Message sent to Amy.

## 2023-11-15 NOTE — Telephone Encounter (Signed)
 Spoke to the [name] about an appointment, at this time referred her to [facility] [phone] to get more information. [name] states she has messages on her phone from the adolescent's mom stating that she could take her but nothing formal yet.

## 2023-11-18 DIAGNOSIS — Z419 Encounter for procedure for purposes other than remedying health state, unspecified: Secondary | ICD-10-CM | POA: Diagnosis not present

## 2023-11-21 ENCOUNTER — Ambulatory Visit: Payer: Self-pay | Admitting: Nurse Practitioner

## 2023-11-21 ENCOUNTER — Telehealth: Payer: Self-pay | Admitting: Dietician

## 2023-11-21 MED ORDER — ONDANSETRON 4 MG PO TBDP: 4.0000 mg | ORAL_TABLET | Freq: Three times a day (TID) | ORAL | 0 refills | Status: AC | PRN

## 2023-11-21 NOTE — Telephone Encounter (Unsigned)
 Copied from CRM [id number]. Topic: Clinical - Medical Advice >> Nov 21, 2023  8:32 AM Emily Blevins wrote: Reason for CRM: On Trulicity picked it up friday and starting taking it has been puking since she has started it, hasnt been able to keep anything down. Double intake without knowing - was already sick with the other dose but now is super sick. Is requesting for someone to give her a callback on this matter to see if she is able to get the dosage lowered

## 2023-11-21 NOTE — Telephone Encounter (Signed)
 Do not continue to take the Trulicity. I will send in nausea medications in the interim she needs to drink plenty of fluid. Keep office appointment. Once the vomiting has stopped she needs to get back on the metformin and sertraline

## 2023-11-21 NOTE — Telephone Encounter (Signed)
 Duplicate. Please see triage encounter from today .

## 2023-11-21 NOTE — Telephone Encounter (Signed)
 Chief Complaint: Vomiting  Symptoms: nausea, vomiting, abdominal discomfort  Frequency: Comes and goes Pertinent Negatives: Patient denies blood in vomit, chest pain, diarrhea, fever Disposition: [] ED /[] Urgent Care (no appt availability in office) / [x] Appointment(In office/virtual)/ []  [place] [facility]/ [] Home Care/ [] Refused Recommended Disposition /[] [facility]/ []  Follow-up with PCP Additional Notes: Patient states she has been vomiting since Friday when she stated taking the increased dose of  Trulicity. Patient states she [place] unable to keep anything down except water. Patient denies feeling dehydrated, weak, dizzy or faint. Patient reports she stopped taking her Metformin and Anti-anxiety medicine due to the vomiting. Care advice was given and patient has an appointment scheduled with PCP on Friday. Patient requesting additional recommendations from PCP and Rx for anti-nausea medication. Please advise.  Reason for Disposition  [1] MILD vomiting with diarrhea AND [2] present > 5 days  Answer Assessment - Initial Assessment Questions 1. VOMITING SEVERITY: "How many times have you vomited in the past 24 hours?"     - MILD:  1 - 2 times/day    - MODERATE: 3 - 5 times/day, decreased oral intake without significant weight loss or symptoms of dehydration    - SEVERE: 6 or more times/day, vomits everything or nearly everything, with significant weight loss, symptoms of dehydration      2-3 2. ONSET: "When did the vomiting begin?"      Friday  3. FLUIDS: "What fluids or food have you vomited up today?" "Have you been able to keep any fluids down?"     Everything is coming back up, except water  4. ABDOMEN PAIN: "Are your having any abdomen pain?" If Yes : "How bad is it and what does it feel like?" (e.g., crampy, dull, intermittent, constant)      Yes, cramp  5. DIARRHEA: "Is there any diarrhea?" If Yes, ask: "How many times today?"      No 6. CONTACTS: "Is there anyone else  in the family with the same symptoms?"      No  7. CAUSE: "What do you think is causing your vomiting?"     My medication 8. HYDRATION STATUS: "Any signs of dehydration?" (e.g., dry mouth [not only dry lips], too weak to stand) "When did you last urinate?"     No  9. OTHER SYMPTOMS: "Do you have any other symptoms?" (e.g., fever, headache, vertigo, vomiting blood or coffee grounds, recent head injury)     No  Protocols used: Vomiting-A-AH

## 2023-11-21 NOTE — Telephone Encounter (Signed)
 Pt called to report non-stop emesis since receiving increased dose of Trulicity (previously 0.75 mg, now 1.5 mg). Pt states they are unable to keep anything down, including liquids except small amounts of plain water. Pt reports informing PCP of new onset symptoms and is waiting for a response at this time. RD advised pt to consider OTC anti-nausea medication and take small sips of water as tolerated. Also advised pt that if they are feeling faint, weak, dizzy, or have severe cramping, go to ED for treatment of possible dehydration.

## 2023-11-21 NOTE — Addendum Note (Signed)
 Addended by: Eden Emms on: 11/21/2023 01:02 PM   Modules accepted: Orders

## 2023-11-22 NOTE — Telephone Encounter (Signed)
 Called patient and reviewed all information. Patient verbalized understanding. Will call if any further questions.

## 2023-11-23 ENCOUNTER — Encounter (INDEPENDENT_AMBULATORY_CARE_PROVIDER_SITE_OTHER): Payer: Medicaid Other | Admitting: Adult Health

## 2023-11-24 ENCOUNTER — Ambulatory Visit: Payer: Medicaid Other | Admitting: Nurse Practitioner

## 2023-11-24 VITALS — BP 126/82 | HR 72 | Temp 98.4°F | Ht 68.0 in | Wt 270.2 lb

## 2023-11-24 DIAGNOSIS — Z7984 Long term (current) use of oral hypoglycemic drugs: Secondary | ICD-10-CM

## 2023-11-24 DIAGNOSIS — E1165 Type 2 diabetes mellitus with hyperglycemia: Secondary | ICD-10-CM | POA: Diagnosis not present

## 2023-11-24 DIAGNOSIS — F411 Generalized anxiety disorder: Secondary | ICD-10-CM | POA: Diagnosis not present

## 2023-11-24 MED ORDER — SERTRALINE HCL 100 MG PO TABS: 100.0000 mg | ORAL_TABLET | Freq: Every day | ORAL | 2 refills | Status: AC

## 2023-11-24 MED ORDER — GLIPIZIDE 5 MG PO TABS: 5.0000 mg | ORAL_TABLET | Freq: Two times a day (BID) | ORAL | 3 refills | Status: DC

## 2023-11-24 NOTE — Assessment & Plan Note (Signed)
 Patient currently maintained on sertraline 100 mg daily.  States that she has had some improvement with the use of the medication.  Patient has HI/SI/AVH.  PHQ-9 and GAD-7 administered in office.  Continue medication as prescribed

## 2023-11-24 NOTE — Assessment & Plan Note (Signed)
 Currently maintained on metformin 500 mg twice daily.  Patient has a marked increase in her A1c last time in office.  She was started on Trulicity at 0.75 mg weekly and then titrated up to 1.5 mg weekly.  Patient tolerated the 0.75 mg well on the titration she did get sick but not after the injection and for several nights after.  States she is leveled out currently and is able to eat and drink.  We will discontinue Trulicity as this is likely the culprit given she has had a intolerance to a GLP-1 receptor agonist in the past.  Start patient on glipizide 5 mg twice daily.
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
## 2023-11-27 ENCOUNTER — Encounter (INDEPENDENT_AMBULATORY_CARE_PROVIDER_SITE_OTHER): Payer: Self-pay

## 2023-12-07 ENCOUNTER — Ambulatory Visit: Payer: Medicaid Other | Admitting: Neurology

## 2023-12-08 ENCOUNTER — Ambulatory Visit: Payer: Medicaid Other | Admitting: Dietician

## 2023-12-30 DIAGNOSIS — Z419 Encounter for procedure for purposes other than remedying health state, unspecified: Secondary | ICD-10-CM | POA: Diagnosis not present

## 2024-01-10 ENCOUNTER — Telehealth: Payer: Self-pay

## 2024-01-10 DIAGNOSIS — E1165 Type 2 diabetes mellitus with hyperglycemia: Secondary | ICD-10-CM

## 2024-01-10 NOTE — Telephone Encounter (Signed)
 Can we call and see what her readings are? Can we also call and see if she is eating when taking the medication

## 2024-01-10 NOTE — Telephone Encounter (Signed)
 Contacted pt regarding low sugar levels.  Pt complains of readings that are in the 80s. States she is eating 15-30 minutes before taking Glipizide .  Says it takes about 30-45 minutes for her numbers to go back to normal but is seeing issues of readings spiking to 300s hours after taking medication.  Pt states that last night her readings were 336.

## 2024-01-10 NOTE — Telephone Encounter (Signed)
 Copied from CRM [id number]. Topic: Clinical - Medication Question >> Jan 10, 2024  3:10 PM Emily Blevins wrote: Reason for CRM: pt called to speak with provider regarding changes her medication . Pt states she is currently taking glipiZIDE  (GLUCOTROL ) 5 MG tablet. It's making her sugar drop too low. Please call pt back to discuss changing her medication at [phone]

## 2024-01-11 MED ORDER — GLIPIZIDE ER 10 MG PO TB24: 10.0000 mg | ORAL_TABLET | Freq: Every day | ORAL | 0 refills | Status: DC

## 2024-01-11 NOTE — Addendum Note (Signed)
 Addended by: Dorothe Gaster on: 01/11/2024 01:56 PM   Modules accepted: Orders

## 2024-01-11 NOTE — Telephone Encounter (Signed)
 Called pt. No answer and Unable to leave voicemail.

## 2024-01-11 NOTE — Telephone Encounter (Signed)
 She can stop the glipizide  5mg  BID and I will change it to glipizde 10mg  XL once daily

## 2024-01-12 ENCOUNTER — Telehealth: Payer: Self-pay

## 2024-01-12 DIAGNOSIS — E1165 Type 2 diabetes mellitus with hyperglycemia: Secondary | ICD-10-CM

## 2024-01-12 MED ORDER — TRULICITY 0.75 MG/0.5ML ~~LOC~~ SOAJ: 0.7500 mg | SUBCUTANEOUS | 0 refills | Status: DC

## 2024-01-12 NOTE — Telephone Encounter (Signed)
 Copied from CRM [id number]. Topic: Clinical - Medication Question >> Jan 11, 2024  4:29 PM Antwanette L wrote: Reason for CRM: The patient doesn't won't to take the glipiZIDE  (GLUCOTROL  XL) 10 MG 24 hr tablet but she wants to know if Winthrop Hawks NP can prescribe the Trulicity shot instead. The patient can be contacted through mychart and by phone at [phone]

## 2024-01-12 NOTE — Telephone Encounter (Signed)
 Contacted pt.  Pt complains that she only felt sick after taking trulicity when the dosage went up.  Pt states that she did not give medication enough time to work through her body.  Pt complains that Trulicity is the only prescription that has helped. Pt states that she does not like taking any glipizides.

## 2024-01-12 NOTE — Telephone Encounter (Signed)
 From my understanding she had adverse drug effects from Trulicity last time. She wants to try it again?

## 2024-01-12 NOTE — Telephone Encounter (Signed)
 Continue glipizide  once she gets the trulicity prescription

## 2024-01-12 NOTE — Addendum Note (Signed)
 Addended by: Dorothe Gaster on: 01/12/2024 04:10 PM   Modules accepted: Orders

## 2024-01-26 ENCOUNTER — Encounter: Payer: Self-pay | Admitting: Nurse Practitioner

## 2024-01-26 ENCOUNTER — Ambulatory Visit (INDEPENDENT_AMBULATORY_CARE_PROVIDER_SITE_OTHER): Admitting: Nurse Practitioner

## 2024-01-26 VITALS — BP 126/76 | HR 68 | Temp 98.0°F | Ht 68.0 in | Wt 277.2 lb

## 2024-01-26 DIAGNOSIS — Z7984 Long term (current) use of oral hypoglycemic drugs: Secondary | ICD-10-CM | POA: Diagnosis not present

## 2024-01-26 DIAGNOSIS — Z7985 Long-term (current) use of injectable non-insulin antidiabetic drugs: Secondary | ICD-10-CM

## 2024-01-26 DIAGNOSIS — E1165 Type 2 diabetes mellitus with hyperglycemia: Secondary | ICD-10-CM | POA: Diagnosis not present

## 2024-01-26 LAB — POCT GLYCOSYLATED HEMOGLOBIN (HGB A1C): Hemoglobin A1C: 7.9 % — AB (ref 4.0–5.6)

## 2024-01-26 NOTE — Progress Notes (Signed)
 Established Patient Office Visit  Subjective   Patient ID: Emily Blevins, female    DOB: 1986-08-25  Age: 38 y.o. MRN: [id number]  Chief Complaint  Patient presents with   Medical Management of Chronic Issues    Here for DM f/u.    HPI  DM2: Patient was seen by me on 11/24/2023 at that juncture she was on metformin  500 mg twice daily her A1c had increased.  She was also on Trulicity but had adverse drug event on the second dose of 1.5 mg we discontinued this medication and started patient on glipizide  5 mg twice daily.  Patient called and said she had mild fluctuation with her sugar and some hypoglycemia even with taking medications with food.  Offered to switch this to glipizide  10 mg XL patient politely declined and says she want to go back on Trulicity even after consultation of her having the adverse drug events.  Patient states that she did not get the medication long enough. States that she has two doses of the trulicity. States that she has had some nausea with the first dose.  She states that she is eating a shake for breakfast. Lunch is hit and miss because of work. States that her largest is her last meal. Shakes and water She is exercising with [employer] 4-5 days a week     Review of Systems  Constitutional:  Negative for chills and fever.  Respiratory:  Negative for shortness of breath.   Cardiovascular:  Negative for chest pain.  Gastrointestinal:  Positive for nausea. Negative for abdominal pain, diarrhea and vomiting.       Every day   Neurological:  Negative for dizziness and headaches.  Psychiatric/Behavioral:  Negative for hallucinations and suicidal ideas.       Objective:     BP 126/76   Pulse 68   Temp 98 F (36.7 C) (Oral)   Ht 5\' 8"  (1.727 m)   Wt 277 lb 4 oz (125.8 kg)   LMP 01/14/2024   SpO2 97%   BMI 42.16 kg/m  BP Readings from Last 3 Encounters:  01/26/24 126/76  11/24/23 126/82  10/17/23 (!) 151/100   Wt Readings from Last 3  Encounters:  01/26/24 277 lb 4 oz (125.8 kg)  11/24/23 270 lb 3.2 oz (122.6 kg)  10/17/23 275 lb (124.7 kg)   SpO2 Readings from Last 3 Encounters:  01/26/24 97%  11/24/23 97%  10/17/23 96%      Physical Exam Vitals and nursing note reviewed.  Constitutional:      Appearance: Normal appearance.  Cardiovascular:     Rate and Rhythm: Normal rate and regular rhythm.     Heart sounds: Normal heart sounds.  Pulmonary:     Effort: Pulmonary effort is normal.     Breath sounds: Normal breath sounds.  Abdominal:     General: Bowel sounds are normal. There is no distension.     Palpations: There is no mass.     Tenderness: There is no abdominal tenderness.     Hernia: No hernia is present.  Neurological:     Mental Status: She is alert.     Title   Diabetic Foot Exam - detailed Date & Time: 01/26/2024  8:33 AM Diabetic Foot exam was performed with the following findings: Yes  Is there a history of foot ulcer?: No Is there a foot ulcer now?: No Is there swelling?: No Is there elevated skin temperature?: No Is there abnormal foot shape?: No  Is there a claw toe deformity?: No Are the toenails long?: No Are the toenails thick?: No Are the toenails ingrown?: No Is the skin thin, fragile, shiny and hairless?": No Pulse Foot Exam completed.: Yes   Right Posterior Tibialis: Present Left posterior Tibialis: Present   Right Dorsalis Pedis: Present Left Dorsalis Pedis: Present     Sensory Foot Exam Completed.: Yes Semmes-Weinstein Monofilament Test "+" means "has sensation" and "-" means "no sensation"   R Site 6: Neg L Site 6: Neg     Image components are not supported.   Image components are not supported. Image components are not supported.  Tuning Fork Comments All other 9 site with patient intact bilaterally.  Patient does have thickened skin on site 6 of bilateral feet I do think this is the reason for decreasing patient     Results for orders placed or performed in  visit on 01/26/24  POCT glycosylated hemoglobin (Hb A1C)  Result Value Ref Range   Hemoglobin A1C 7.9 (A) 4.0 - 5.6 %   HbA1c POC (<> result, manual entry)     HbA1c, POC (prediabetic range)     HbA1c, POC (controlled diabetic range)        The ASCVD Risk score (Arnett DK, et al., 2019) failed to calculate for the following reasons:   The 2019 ASCVD risk score is only valid for ages 39 to 36    Assessment & Plan:   Problem List Items Addressed This Visit       Endocrine   Type 2 diabetes mellitus with hyperglycemia, without long-term current use of insulin  (HCC) - Primary   Patient currently maintained on metformin  5 mg twice daily and Trulicity 0.75 mg once weekly.  Patient is working lifestyle modifications with exercising 5 times a week and working on her nutritional intake.  Patient A1c did trend down from 11.1% to 7.9%.  As of right now patient is tolerating Trulicity 0.75 mg well.  We will plan to continue the patient's usual again she had adverse drug reaction to the 1.5 mg.  Patient also had a marked decrease in A1c.  Continue medication as prescribed continue working hydroxyl modifications.      Relevant Orders   POCT glycosylated hemoglobin (Hb A1C) (Completed)    Return in about 3 months (around 04/27/2024) for DM recheck.    Margarie Shay, NP

## 2024-01-26 NOTE — Patient Instructions (Signed)
 A1C is 7.9% Keep up the good work Follow up with me in 3 months

## 2024-01-26 NOTE — Assessment & Plan Note (Signed)
 Patient currently maintained on metformin  5 mg twice daily and Trulicity 0.75 mg once weekly.  Patient is working lifestyle modifications with exercising 5 times a week and working on her nutritional intake.  Patient A1c did trend down from 11.1% to 7.9%.  As of right now patient is tolerating Trulicity 0.75 mg well.  We will plan to continue the patient's usual again she had adverse drug reaction to the 1.5 mg.  Patient also had a marked decrease in A1c.  Continue medication as prescribed continue working hydroxyl modifications.

## 2024-01-29 DIAGNOSIS — Z419 Encounter for procedure for purposes other than remedying health state, unspecified: Secondary | ICD-10-CM | POA: Diagnosis not present

## 2024-02-10 ENCOUNTER — Other Ambulatory Visit: Payer: Self-pay | Admitting: Nurse Practitioner

## 2024-02-10 DIAGNOSIS — E1165 Type 2 diabetes mellitus with hyperglycemia: Secondary | ICD-10-CM

## 2024-02-11 ENCOUNTER — Other Ambulatory Visit: Payer: Self-pay | Admitting: Nurse Practitioner

## 2024-02-11 DIAGNOSIS — E1165 Type 2 diabetes mellitus with hyperglycemia: Secondary | ICD-10-CM

## 2024-02-13 ENCOUNTER — Telehealth: Payer: Self-pay

## 2024-02-13 ENCOUNTER — Other Ambulatory Visit (HOSPITAL_COMMUNITY): Payer: Self-pay

## 2024-02-13 NOTE — Telephone Encounter (Signed)
 Per test claim: The current 28 day co-pay is, $4.  No PA needed at this time. PA is effective through 10/16/24.

## 2024-02-13 NOTE — Telephone Encounter (Signed)
 Pharmacy Patient Advocate Encounter   Received notification from Pt Calls Messages that prior authorization for TRULICITY is required/requested.   Insurance verification completed.   The patient is insured through [facility] [place] .   Per test claim: The current 28 day co-pay is, $4.  No PA needed at this time. This test claim was processed through [facility]- copay amounts may vary at other pharmacies due to pharmacy/plan contracts, or as the patient moves through the different stages of their insurance plan.

## 2024-02-13 NOTE — Telephone Encounter (Signed)
 Contacted pharmacy to confirm co pay with no PA needed.  Pharmacist states of no copay but no refills are on file at this time.   LAST APPOINTMENT DATE: 02/13/2024   NEXT APPOINTMENT DATE: 05/03/2024  Dulaglutide  (Trulicity  0.75 mg)   LAST REFILL: 01/12/24  QTY: 2mL #0RF

## 2024-02-13 NOTE — Telephone Encounter (Signed)
 Can we let the patient know please?

## 2024-02-13 NOTE — Telephone Encounter (Signed)
 Refilled through refill request already

## 2024-02-13 NOTE — Telephone Encounter (Signed)
 Copied from CRM [id number]. Topic: Clinical - Medication Prior Auth >> Feb 13, 2024  8:06 AM Dorthula Gavel H wrote: Reason for CRM: Pt called in stating that her pharmacy told her she needs another Prior Auth. For Dulaglutide (TRULICITY) 0.75 MG/0.5ML SOAJ

## 2024-02-22 ENCOUNTER — Other Ambulatory Visit: Payer: Self-pay | Admitting: Nurse Practitioner

## 2024-02-29 DIAGNOSIS — Z419 Encounter for procedure for purposes other than remedying health state, unspecified: Secondary | ICD-10-CM | POA: Diagnosis not present

## 2024-03-26 ENCOUNTER — Emergency Department
Admission: EM | Admit: 2024-03-26 | Discharge: 2024-03-26 | Disposition: A | Discharge status: Home / Self Care | Source: Ambulatory Visit | Attending: Emergency Medicine | Admitting: Emergency Medicine

## 2024-03-26 ENCOUNTER — Emergency Department

## 2024-03-26 DIAGNOSIS — R202 Paresthesia of skin: Secondary | ICD-10-CM | POA: Diagnosis not present

## 2024-03-26 DIAGNOSIS — E119 Type 2 diabetes mellitus without complications: Secondary | ICD-10-CM | POA: Insufficient documentation

## 2024-03-26 DIAGNOSIS — R200 Anesthesia of skin: Secondary | ICD-10-CM | POA: Insufficient documentation

## 2024-03-26 DIAGNOSIS — R791 Abnormal coagulation profile: Secondary | ICD-10-CM | POA: Diagnosis not present

## 2024-03-26 DIAGNOSIS — J3489 Other specified disorders of nose and nasal sinuses: Secondary | ICD-10-CM | POA: Diagnosis not present

## 2024-03-26 LAB — CBC
HCT: 35 % — ABNORMAL LOW (ref 36.0–46.0)
Hemoglobin: 11 g/dL — ABNORMAL LOW (ref 12.0–15.0)
MCH: 23.7 pg — ABNORMAL LOW (ref 26.0–34.0)
MCHC: 31.4 g/dL (ref 30.0–36.0)
MCV: 75.3 fL — ABNORMAL LOW (ref 80.0–100.0)
Platelets: 249 K/uL (ref 150–400)
RBC: 4.65 MIL/uL (ref 3.87–5.11)
RDW: 15.7 % — ABNORMAL HIGH (ref 11.5–15.5)
WBC: 9.4 K/uL (ref 4.0–10.5)
nRBC: 0 % (ref 0.0–0.2)

## 2024-03-26 LAB — PROTIME-INR
INR: 1 (ref 0.8–1.2)
Prothrombin Time: 14 s (ref 11.4–15.2)

## 2024-03-26 LAB — COMPREHENSIVE METABOLIC PANEL WITH GFR
ALT: 17 U/L (ref 0–44)
AST: 17 U/L (ref 15–41)
Albumin: 4.2 g/dL (ref 3.5–5.0)
Alkaline Phosphatase: 56 U/L (ref 38–126)
Anion gap: 10 (ref 5–15)
BUN: 22 mg/dL — ABNORMAL HIGH (ref 6–20)
CO2: 21 mmol/L — ABNORMAL LOW (ref 22–32)
Calcium: 9.3 mg/dL (ref 8.9–10.3)
Chloride: 107 mmol/L (ref 98–111)
Creatinine, Ser: 0.82 mg/dL (ref 0.44–1.00)
GFR, Estimated: >60 mL/min (ref >60)
Glucose, Bld: 186 mg/dL — ABNORMAL HIGH (ref 70–99)
Potassium: 3.9 mmol/L (ref 3.5–5.1)
Sodium: 138 mmol/L (ref 135–145)
Total Bilirubin: 0.4 mg/dL (ref 0.0–1.2)
Total Protein: 7.9 g/dL (ref 6.5–8.1)

## 2024-03-26 LAB — ETHANOL: Alcohol, Ethyl (B): <15 mg/dL (ref <15)

## 2024-03-26 LAB — DIFFERENTIAL
Abs Immature Granulocytes: 0.03 K/uL (ref 0.00–0.07)
Basophils Absolute: 0 K/uL (ref 0.0–0.1)
Basophils Relative: 0 %
Eosinophils Absolute: 0.1 K/uL (ref 0.0–0.5)
Eosinophils Relative: 1 %
Immature Granulocytes: 0 %
Lymphocytes Relative: 25 %
Lymphs Abs: 2.4 K/uL (ref 0.7–4.0)
Monocytes Absolute: 0.6 K/uL (ref 0.1–1.0)
Monocytes Relative: 6 %
Neutro Abs: 6.3 K/uL (ref 1.7–7.7)
Neutrophils Relative %: 68 %

## 2024-03-26 LAB — CBG MONITORING, ED: Glucose-Capillary: 196 mg/dL — ABNORMAL HIGH (ref 70–99)

## 2024-03-26 LAB — APTT: aPTT: 30 s (ref 24–36)

## 2024-03-26 MED ORDER — SODIUM CHLORIDE 0.9% FLUSH: 3.0000 mL | Freq: Once | INTRAVENOUS | Status: DC

## 2024-03-26 NOTE — ED Triage Notes (Signed)
 First Nurse Note: Patient to ED from [facility] for lip numbness and change in speech. Slurred/ garbled speech. LWK 3 days ago. States cbg runnign high at home.

## 2024-03-26 NOTE — Discharge Instructions (Signed)
 Please follow-up with your primary care provider outpatient for ongoing evaluation.  You can also reach out to the neurology team he saw in the past for evaluation as needed.  If you have any one-sided weakness or numbness, complete loss of speech, please return immediately to the emergency department.

## 2024-03-26 NOTE — ED Provider Notes (Signed)
 [facility] Provider Note    Event Date/Time   First MD Initiated Contact with Patient 03/26/24 2115     (approximate)   History   Numbness   HPI Emily Blevins is a 38 y.o. female with history of DM2, prior Bell's palsy presenting today for numbness.  Patient states about 3 days ago she started having some numbness on her lower lip.  Still can feel it but sensation is diminished.  Symptoms can come and go.  She also reports a tingling sensation throughout her arms and legs when she gets up and walks around.  They are not present when she is at rest.  She also noted concern of possible speech change.  She stated it was not slurred speech or loss of speech but occasional difficulty with getting a word out.  Denies any other numbness or weakness symptoms.  No other speech changes or vision changes.  No hearing loss.  She states she has been having issues with her blood sugar at home recently going both high and low.  Also has fluctuations in her blood pressure as well.  Denies any falls or head trauma.     Physical Exam   Triage Vital Signs: ED Triage Vitals  Encounter Vitals Group     BP 03/26/24 1749 (!) 151/81     Girls Systolic BP Percentile --      Girls Diastolic BP Percentile --      Boys Systolic BP Percentile --      Boys Diastolic BP Percentile --      Pulse Rate 03/26/24 1749 67     Resp 03/26/24 1749 18     Temp 03/26/24 1749 98.5 F (36.9 C)     Temp Source 03/26/24 1749 Oral     SpO2 03/26/24 1749 99 %     Weight 03/26/24 1750 270 lb (122.5 kg)     Height 03/26/24 1750 5' 8 (1.727 m)     Head Circumference --      Peak Flow --      Pain Score 03/26/24 1750 0     Pain Loc --      Pain Education --      Exclude from Growth Chart --     Most recent vital signs: Vitals:   03/26/24 1749  BP: (!) 151/81  Pulse: 67  Resp: 18  Temp: 98.5 F (36.9 C)  SpO2: 99%   I have reviewed the vital signs. General:  Awake, alert, no acute  distress. Head:  Normocephalic, Atraumatic. EENT:  PERRL, EOMI, Oral mucosa pink and moist, Neck is supple. Cardiovascular: Regular rate, 2+ distal pulses. Respiratory:  Normal respiratory effort, symmetrical expansion, no distress.   Extremities:  Moving all four extremities through full ROM without pain.   Neuro:  Alert and oriented.  Interacting appropriately.  Mild decrease sensation throughout the entirety of her lower lip.  No sensation changes elsewise throughout the entire face.  No current numbness or paresthesias anywhere throughout upper or lower extremities.  No weakness appreciated throughout any of her 4 extremities.  Cranial nerves II through XII otherwise intact with no obvious speech changes present at this time. Skin:  Warm, dry, no rash.   Psych: Appropriate affect.     ED Results / Procedures / Treatments   Labs (all labs ordered are listed, but only abnormal results are displayed) Labs Reviewed  CBC - Abnormal; Notable for the following components:      Result Value  Hemoglobin 11.0 (*)    HCT 35.0 (*)    MCV 75.3 (*)    MCH 23.7 (*)    RDW 15.7 (*)    All other components within normal limits  COMPREHENSIVE METABOLIC PANEL WITH GFR - Abnormal; Notable for the following components:   CO2 21 (*)    Glucose, Bld 186 (*)    BUN 22 (*)    All other components within normal limits  CBG MONITORING, ED - Abnormal; Notable for the following components:   Glucose-Capillary 196 (*)    All other components within normal limits  PROTIME-INR  APTT  DIFFERENTIAL  ETHANOL  I-STAT CREATININE, ED  POC URINE PREG, ED     EKG    RADIOLOGY Independently interpreted CT head with no acute pathology   PROCEDURES:  Critical Care performed: No  Procedures   MEDICATIONS ORDERED IN ED: Medications  sodium chloride  flush (NS) 0.9 % injection 3 mL (has no administration in time range)     IMPRESSION / MDM / ASSESSMENT AND PLAN / ED COURSE  I reviewed the  triage vital signs and the nursing notes.                              Differential diagnosis includes, but is not limited to, paresthesias, electrolyte abnormality, CVA, hypoglycemia, hypotension  Patient's presentation is most consistent with acute complicated illness / injury requiring diagnostic workup.  Patient is a 38 year old female coming in today for numbness to her lower lip.  Has intermittent tingling when she walks throughout all of her extremities but that is not currently present.  Also was concerned of some speech changes that are also not present on her exam at this time.  The only thing on her physical exam at this moment is decreased sensation of her left but it is not unilateral.  Otherwise completely unremarkable neurological exam.  Vital signs with slight hypertension but otherwise reassuring.  CBC with mild anemia but nothing unusual.  CMP with mild hyperglycemia.  Ethanol level negative.  CT head was ordered in triage which shows no acute intracranial pathology.  Unclear etiology exactly of what is causing this at the moment as it is not a typical strokelike symptom to have isolated lower lip paresthesia/numbness.  I did state to patient that the only way we could fully rule out CVA was through MRI.  She did not feel like she was having a stroke and felt it might be more related to her hypoglycemia which certainly could be possible.  Did state that I cannot fully rule out CVA though.  She is understanding of this but otherwise given reassuring workup at this time no further studies to be performed elsewise.  We discussed discharge at this time which she prefers and follow-up with her outpatient primary care provider.  She had seen a neurologist in the past for her Bell's palsy and recommend she follow-up with them as well.  Given strict return precautions for any worsening strokelike symptoms which she is agreeable with.     FINAL CLINICAL IMPRESSION(S) / ED DIAGNOSES   Final  diagnoses:  Lip numbness     Rx / DC Orders   ED Discharge Orders     None        Note:  This document was prepared using Dragon voice recognition software and may include unintentional dictation errors.   Malvina Alm DASEN, MD 03/26/24 2226

## 2024-03-26 NOTE — ED Triage Notes (Signed)
 Refer to first nurse note. Pt reports lip, elbow and knee numbness x3 days. Pt also reports a change in speech 3 days ago. Pt A&Ox4 at time of triage. No weakness.

## 2024-03-30 DIAGNOSIS — Z419 Encounter for procedure for purposes other than remedying health state, unspecified: Secondary | ICD-10-CM | POA: Diagnosis not present

## 2024-04-04 ENCOUNTER — Other Ambulatory Visit: Payer: Self-pay | Admitting: Nurse Practitioner

## 2024-04-04 DIAGNOSIS — E1165 Type 2 diabetes mellitus with hyperglycemia: Secondary | ICD-10-CM

## 2024-04-11 ENCOUNTER — Telehealth: Payer: Self-pay | Admitting: Neurology

## 2024-04-11 NOTE — Telephone Encounter (Signed)
 FYI only:  I reviewed patient's ophthalmology visit note from 09/05/2023.  She saw Dr. Lamar Bruckner.  Impression/plan: Empty sella syndrome.  Bell's palsy.  Follow-up in 6 months was recommended.  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  OCT showed possible blurry disc margins making it suspicious for papilledema but it was not felt to be frank papilledema.  Her nerves looked healthy.  Repeat studies were recommended in 6 months.  I will have the full report scanned in.

## 2024-04-12 ENCOUNTER — Ambulatory Visit: Admitting: Nurse Practitioner

## 2024-04-12 ENCOUNTER — Ambulatory Visit: Admitting: Family Medicine

## 2024-04-19 ENCOUNTER — Ambulatory Visit: Admitting: Nurse Practitioner

## 2024-04-19 VITALS — BP 124/78 | HR 75 | Temp 98.1°F | Ht 68.0 in | Wt 271.2 lb

## 2024-04-19 DIAGNOSIS — R7989 Other specified abnormal findings of blood chemistry: Secondary | ICD-10-CM

## 2024-04-19 DIAGNOSIS — L304 Erythema intertrigo: Secondary | ICD-10-CM

## 2024-04-19 DIAGNOSIS — Z09 Encounter for follow-up examination after completed treatment for conditions other than malignant neoplasm: Secondary | ICD-10-CM | POA: Diagnosis not present

## 2024-04-19 DIAGNOSIS — Z7984 Long term (current) use of oral hypoglycemic drugs: Secondary | ICD-10-CM | POA: Diagnosis not present

## 2024-04-19 DIAGNOSIS — E1165 Type 2 diabetes mellitus with hyperglycemia: Secondary | ICD-10-CM | POA: Diagnosis not present

## 2024-04-19 DIAGNOSIS — Z7985 Long-term (current) use of injectable non-insulin antidiabetic drugs: Secondary | ICD-10-CM | POA: Diagnosis not present

## 2024-04-19 DIAGNOSIS — R202 Paresthesia of skin: Secondary | ICD-10-CM | POA: Diagnosis not present

## 2024-04-19 LAB — CBC
HCT: 35.1 % — ABNORMAL LOW (ref 36.0–46.0)
Hemoglobin: 11.4 g/dL — ABNORMAL LOW (ref 12.0–15.0)
MCHC: 32.4 g/dL (ref 30.0–36.0)
MCV: 73 fl — ABNORMAL LOW (ref 78.0–100.0)
Platelets: 230 K/uL (ref 150.0–400.0)
RBC: 4.81 Mil/uL (ref 3.87–5.11)
RDW: 16.8 % — ABNORMAL HIGH (ref 11.5–15.5)
WBC: 7.6 K/uL (ref 4.0–10.5)

## 2024-04-19 LAB — VITAMIN B12: Vitamin B-12: 293 pg/mL (ref 211–911)

## 2024-04-19 LAB — POCT GLYCOSYLATED HEMOGLOBIN (HGB A1C): Hemoglobin A1C: 6.4 % — AB (ref 4.0–5.6)

## 2024-04-19 LAB — FOLATE: Folate: 15.7 ng/mL (ref >5.9)

## 2024-04-19 LAB — IBC + FERRITIN
Ferritin: 3.6 ng/mL — ABNORMAL LOW (ref 10.0–291.0)
Iron: 30 ug/dL — ABNORMAL LOW (ref 42–145)
Saturation Ratios: 6 % — ABNORMAL LOW (ref 20.0–50.0)
TIBC: 497 ug/dL — ABNORMAL HIGH (ref 250.0–450.0)
Transferrin: 355 mg/dL (ref 212.0–360.0)

## 2024-04-19 LAB — TSH: TSH: 2.31 u[IU]/mL (ref 0.35–5.50)

## 2024-04-19 MED ORDER — NYSTATIN 100000 UNIT/GM EX POWD: 1.0000 | Freq: Three times a day (TID) | CUTANEOUS | 0 refills | Status: AC

## 2024-04-19 NOTE — Progress Notes (Signed)
 Established Patient Office Visit  Subjective   Patient ID: Emily Blevins, female    DOB: 1986/03/07  Age: 38 y.o. MRN: [id number]  Chief Complaint  Patient presents with   Diabetes    Pt complains of knee and elbow, lips going numb. Pt states that she knows her sugar levels are high if her lips go numb but states when checking her levels they have been normal.    Medication Management    Pt requests to increase dosage of Trulicity .     HPI   DM2: Patient was last seen on 01/26/2024 where she was maintained on metformin  500 mg twice daily and Trulicity  0.75 mg weekly she is working on lifestyle modifications with exercising 5 times a week her A1c trended down nicely last A1c was 7.9%.  We did try to increase Trulicity  in the past and patient had adverse drug event.  She is here for follow-up today. She has been checking her sugar at home. As of late she has been out of strips. States that she was checking in 1-2 times a day. States that she is getting 120s-140s. States that she has had some low sugar levels. States that if it falls under 100 she does not feel good.   Hospital follow-up: Patient was seen in the emergency department on 03/26/2024 for history of numbness that started approximately 3 days ago in her lower lip prior to the visit symptoms were intermittent she is also having that sensation throughout her arms and legs..  They did lab work along with a CT scan of the head.  They did recommend doing an MRI of head which patient politely declined as she does not think she is having a stroke.  Did recommend follow-up with neurology States that she is still experiencing it. States that it is happening dialy. State that she is walking at work and it is hot. States that it only happens when she walks. Her lips, knee caps and elbows will go numb/tingling. It is intermittent with walking   She is taking 3 tablets of iron a day     Review of Systems  Constitutional:  Negative for chills  and fever.  Respiratory:  Negative for shortness of breath.   Cardiovascular:  Negative for chest pain.  Gastrointestinal:  Negative for abdominal pain, nausea and vomiting.       BM daily   Neurological:  Positive for tingling. Negative for dizziness and headaches.      Objective:     BP 124/78   Pulse 75   Temp 98.1 F (36.7 C) (Oral)   Ht 5' 8 (1.727 m)   Wt 271 lb 3.2 oz (123 kg)   LMP 03/03/2024   SpO2 98%   BMI 41.24 kg/m  BP Readings from Last 3 Encounters:  04/19/24 124/78  03/26/24 (!) 151/81  01/26/24 126/76   Wt Readings from Last 3 Encounters:  04/19/24 271 lb 3.2 oz (123 kg)  03/26/24 270 lb (122.5 kg)  01/26/24 277 lb 4 oz (125.8 kg)   SpO2 Readings from Last 3 Encounters:  04/19/24 98%  03/26/24 99%  01/26/24 97%      Physical Exam Vitals and nursing note reviewed.  Constitutional:      Appearance: Normal appearance.  Cardiovascular:     Rate and Rhythm: Normal rate and regular rhythm.     Heart sounds: Normal heart sounds.  Pulmonary:     Effort: Pulmonary effort is normal.     Breath sounds:  Normal breath sounds.  Neurological:     Mental Status: She is alert.      Results for orders placed or performed in visit on 04/19/24  POCT glycosylated hemoglobin (Hb A1C)  Result Value Ref Range   Hemoglobin A1C 6.4 (A) 4.0 - 5.6 %   HbA1c POC (<> result, manual entry)     HbA1c, POC (prediabetic range)     HbA1c, POC (controlled diabetic range)        The ASCVD Risk score (Arnett DK, et al., 2019) failed to calculate for the following reasons:   The 2019 ASCVD risk score is only valid for ages 57 to 36    Assessment & Plan:   Problem List Items Addressed This Visit       Endocrine   Type 2 diabetes mellitus with hyperglycemia, without long-term current use of insulin  (HCC) - Primary   Patient currently maintained on metformin  500 mg twice daily and Trulicity  0.75 mg weekly.  A1c well-controlled today.  Patient is experiencing some  hypoglycemia we will not increase any medication today.  Continue working healthy lifestyle modifications      Relevant Orders   POCT glycosylated hemoglobin (Hb A1C) (Completed)     Musculoskeletal and Integument   Intertrigo   Nystatin powder 3 times daily as needed      Relevant Medications   nystatin (MYCOSTATIN/NYSTOP) powder     Other   Hospital discharge follow-up   Did review hospital note, labs, imaging.      Paresthesia   Ambiguous in nature did review hospital note we will check B12 repeat CBC and TSH today.      Relevant Orders   CBC   IBC + Ferritin   Vitamin B12   Folate   TSH   Abnormal CBC   Pending B12, folate, iron studies and repeat CBC.      Relevant Orders   CBC   IBC + Ferritin   Vitamin B12   Folate   TSH    Return in 3 months (on 07/20/2024) for DM recheck.    Adina Crandall, NP

## 2024-04-19 NOTE — Assessment & Plan Note (Signed)
 Nystatin powder 3 times daily as needed

## 2024-04-19 NOTE — Patient Instructions (Signed)
 Nice to see you today  I will be in touch with the labs once I have them   Follow up with me in 3 months, sooner if you need me   Keep the skin clean and dry. Pat dry do not rub dry

## 2024-04-19 NOTE — Assessment & Plan Note (Signed)
 Did review hospital note, labs, imaging.

## 2024-04-19 NOTE — Assessment & Plan Note (Signed)
 Patient currently maintained on metformin  500 mg twice daily and Trulicity  0.75 mg weekly.  A1c well-controlled today.  Patient is experiencing some hypoglycemia we will not increase any medication today.  Continue working healthy lifestyle modifications

## 2024-04-19 NOTE — Assessment & Plan Note (Signed)
 Ambiguous in nature did review hospital note we will check B12 repeat CBC and TSH today.

## 2024-04-19 NOTE — Assessment & Plan Note (Signed)
 Pending B12, folate, iron studies and repeat CBC.

## 2024-04-23 ENCOUNTER — Ambulatory Visit: Payer: Self-pay | Admitting: Nurse Practitioner

## 2024-04-23 DIAGNOSIS — D509 Iron deficiency anemia, unspecified: Secondary | ICD-10-CM

## 2024-04-23 MED ORDER — IRON (FERROUS SULFATE) 325 (65 FE) MG PO TABS
325.0000 mg | ORAL_TABLET | Freq: Every day | ORAL | 0 refills | Status: DC
Start: 2024-04-23 — End: 2024-05-15

## 2024-04-30 DIAGNOSIS — Z419 Encounter for procedure for purposes other than remedying health state, unspecified: Secondary | ICD-10-CM | POA: Diagnosis not present

## 2024-05-03 ENCOUNTER — Ambulatory Visit: Admitting: Nurse Practitioner

## 2024-05-09 ENCOUNTER — Other Ambulatory Visit: Payer: Self-pay | Admitting: Nurse Practitioner

## 2024-05-09 DIAGNOSIS — E1165 Type 2 diabetes mellitus with hyperglycemia: Secondary | ICD-10-CM

## 2024-05-15 ENCOUNTER — Other Ambulatory Visit: Payer: Self-pay | Admitting: Nurse Practitioner

## 2024-05-15 DIAGNOSIS — D509 Iron deficiency anemia, unspecified: Secondary | ICD-10-CM

## 2024-05-24 ENCOUNTER — Telehealth: Payer: Self-pay | Admitting: Nurse Practitioner

## 2024-05-24 ENCOUNTER — Other Ambulatory Visit

## 2024-05-24 DIAGNOSIS — D509 Iron deficiency anemia, unspecified: Secondary | ICD-10-CM | POA: Diagnosis not present

## 2024-05-24 LAB — IBC + FERRITIN
Ferritin: 12.2 ng/mL (ref 10.0–291.0)
Iron: 66 ug/dL (ref 42–145)
Saturation Ratios: 14.7 % — ABNORMAL LOW (ref 20.0–50.0)
TIBC: 449.4 ug/dL (ref 250.0–450.0)
Transferrin: 321 mg/dL (ref 212.0–360.0)

## 2024-05-24 LAB — CBC
HCT: 37.6 % (ref 36.0–46.0)
Hemoglobin: 12.2 g/dL (ref 12.0–15.0)
MCHC: 32.4 g/dL (ref 30.0–36.0)
MCV: 75.6 fl — ABNORMAL LOW (ref 78.0–100.0)
Platelets: 240 K/uL (ref 150.0–400.0)
RBC: 4.98 Mil/uL (ref 3.87–5.11)
RDW: 19.1 % — ABNORMAL HIGH (ref 11.5–15.5)
WBC: 7.3 K/uL (ref 4.0–10.5)

## 2024-05-24 NOTE — Telephone Encounter (Signed)
 Pt asked if she can have an excuse note to be written or sent to her due to her having jury duty and she states that she has anxiety.

## 2024-05-24 NOTE — Telephone Encounter (Signed)
 Mail box full not able to leave message need more information.

## 2024-05-27 ENCOUNTER — Ambulatory Visit: Payer: Self-pay | Admitting: Nurse Practitioner

## 2024-05-27 DIAGNOSIS — D509 Iron deficiency anemia, unspecified: Secondary | ICD-10-CM

## 2024-05-31 DIAGNOSIS — Z419 Encounter for procedure for purposes other than remedying health state, unspecified: Secondary | ICD-10-CM | POA: Diagnosis not present

## 2024-06-07 MED ORDER — FERROUS SULFATE 325 (65 FE) MG PO TABS: ORAL_TABLET | ORAL | Status: DC

## 2024-06-21 ENCOUNTER — Ambulatory Visit: Payer: Self-pay
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
## 2024-06-21 NOTE — Telephone Encounter (Signed)
 Noted. I appreciate Dr. Al Alias evaluation

## 2024-06-28 ENCOUNTER — Ambulatory Visit: Payer: Self-pay | Admitting: Family Medicine

## 2024-06-28 ENCOUNTER — Ambulatory Visit (INDEPENDENT_AMBULATORY_CARE_PROVIDER_SITE_OTHER): Admitting: Family Medicine

## 2024-06-28 ENCOUNTER — Ambulatory Visit
Admission: RE | Admit: 2024-06-28 | Discharge: 2024-06-28 | Disposition: A | Discharge status: Home / Self Care | Source: Ambulatory Visit | Attending: Family Medicine | Admitting: Family Medicine

## 2024-06-28 ENCOUNTER — Encounter: Payer: Self-pay | Admitting: Family Medicine

## 2024-06-28 VITALS — BP 132/78 | HR 71 | Temp 98.3°F | Ht 68.0 in | Wt 272.0 lb

## 2024-06-28 DIAGNOSIS — G8929 Other chronic pain: Secondary | ICD-10-CM | POA: Diagnosis not present

## 2024-06-28 DIAGNOSIS — M25512 Pain in left shoulder: Secondary | ICD-10-CM
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
## 2024-06-28 NOTE — Patient Instructions (Signed)
 Use ice on your shoulder whenever you can   Get voltaren gel over the counter and use up to 4 times daily topically   Do the passive range of motion exercises   Xray now  We will reach out with results and plan
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
## 2024-07-01 ENCOUNTER — Telehealth: Payer: Self-pay | Admitting: *Deleted

## 2024-07-01 NOTE — Telephone Encounter (Signed)
 Pt viewed xrays via mychart but per DR. Tower, pt needs a f/u appt with Dr. Watt to eval shoulder. Please schedule

## 2024-07-03 NOTE — Progress Notes (Signed)
 Emily Bacot T. Valdis Bevill, MD, CAQ Sports Medicine [facility] [address] [place], [address]  Phone: [phone]  FAX: [phone]  Emily Blevins - 38 y.o. female  MRN [id number]  Date of Birth: 1985/12/09  Date: 07/04/2024  PCP: Wendee Lynwood HERO, NP  Referral: Wendee Lynwood HERO, NP  Chief Complaint  Patient presents with   Shoulder Pain    Left   Subjective:   Emily Blevins is a 38 y.o. very pleasant female patient with Body mass index is 41.74 kg/m. who presents with the following:  Discussed the use of AI scribe software for clinical note transcription with the patient, who gave verbal consent to proceed.  This is a very pleasant 38 year old patient presents with some ongoing left-sided chronic shoulder pain.  Lab Results  Component Value Date   HGBA1C 6.4 (A) 04/19/2024     History of Present Illness Emily Blevins is a 38 year old female with diabetes who presents with shoulder pain.  She has been experiencing shoulder pain for the past six months, which began after starting a workout routine. 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 The pain is described as a throbbing ache, similar to 'flu-like bone ache', and can become severe enough to induce tears.  There is no history of specific injury to the shoulder. The pain does not disturb her sleep, although she needs to position her arm in a certain way for comfort at night. She experiences limitations in her range of motion, especially when reaching up or behind her back, sometimes requiring assistance from her other hand to move her arm. Occasional numbness in the arm is reported, though not consistently. Despite these limitations, she was able to move heavy furniture over the weekend without significant pain but experiences increased pain  during work activities.  Her diabetes was diagnosed in 2021, and she acknowledges not managing it well until recently. Previously, her blood sugar levels ranged from 300-500 mg/dL but have now improved to the low hundreds.    Review of Systems is noted in the HPI, as appropriate  Objective:   BP 130/80   Pulse 78   Temp 98.2 F (36.8 C) (Temporal)   Ht 5' 8 (1.727 m)   Wt 274 lb 8 oz (124.5 kg)   LMP 06/30/2024   SpO2 97%   BMI 41.74 kg/m   GEN: No acute distress; alert,appropriate. PULM: Breathing comfortably in no respiratory distress PSYCH: Normally interactive.    Shoulder: R and L Inspection: No muscle wasting or winging Ecchymosis/edema: neg  AC joint, scapula, clavicle: NT Cervical spine: NT, full ROM Spurling's: neg ABNORMAL SIDE TESTED: Left UNLESS OTHERWISE NOTED, THE CONTRALATERAL SIDE HAS FULL RANGE OF MOTION. Abduction: 5/5, LIMITED TO 110 DEGREES Flexion: 5/5, LIMITED TO 120 DEGNO ROM  IR, lift-off: 5/5. TESTED AT 90 DEGREES OF ABDUCTION, LIMITED TO 5 DEGREES ER at neutral:  5/5, TESTED AT 90 DEGREES OF ABDUCTION, LIMITED TO 15 DEGREES AC crossover and compression: PAIN Drop Test: neg Empty Can: neg Supraspinatus insertion: NT Bicipital groove: NT ALL OTHER SPECIAL TESTING EQUIVOCAL GIVEN LOSS OF MOTION C5-T1 intact Sensation intact Grip 5/5    Laboratory and Imaging Data:  Assessment and Plan:     ICD-10-CM   1. Adhesive capsulitis of left shoulder  M75.02     2. Chronic left shoulder  pain  M25.512    G89.29      Assessment & Plan Adhesive capsulitis of left shoulder Adhesive capsulitis persists with significant motion restriction and pain, likely related or associated to diabetes. Recovery typically spans twelve months in terms of statistical average. Surgery is rarely needed, with a high recovery likelihood without it. - Instructed on daily shoulder motion exercises for 15 minutes. - Advised use of a broom or golf club for  stretching. - Recommended wall or cabinet stretching exercises during work. - Advised Tylenol  or ibuprofen for pain as needed. - Discussed Voltaren gel for pain relief, not for expedited recovery. - Consider corticosteroid injection if pain persists and exercises ineffective. - Reassured surgery is rarely needed, recovery expected. - Scheduled follow-up in a couple of months to assess progress.   Disposition: Return in about 2 months (around 09/03/2024) for Dr. Watt, frozen shoulder L.  [employer] speech-to-text software was used for transcription in this dictation.  Possible transcriptional errors can occur using Animal nutritionist.   Signed,  Jacques DASEN. Maleiyah Releford, MD   Outpatient Encounter Medications as of 07/04/2024  Medication Sig   Blood Glucose Monitoring Suppl (ONETOUCH VERIO FLEX SYSTEM) w/Device KIT CHECK BLOOD SUGAR EVERY MORNING BEFORE BREAKFAST.   Dulaglutide  (TRULICITY ) 0.75 MG/0.5ML SOAJ INJECT 0.75 MG SUBCUTANEOUSLY ONE TIME PER WEEK   ferrous sulfate  325 (65 FE) MG tablet Take 1 tablet by mouth 3 times weekly (Monday, Wednesday, Friday)   glucose blood (ONETOUCH VERIO) test strip CHECK BLOOD SUGAR BEFORE BREAKFAST EVERY MORNING   metFORMIN  (GLUCOPHAGE ) 500 MG tablet TAKE 1 TABLET BY MOUTH TWICE A DAY   nystatin  (MYCOSTATIN /NYSTOP ) powder Apply 1 Application topically 3 (three) times daily.   ondansetron  (ZOFRAN -ODT) 4 MG disintegrating tablet Take 1 tablet (4 mg total) by mouth every 8 (eight) hours as needed.   sertraline  (ZOLOFT ) 100 MG tablet Take 1 tablet (100 mg total) by mouth daily.   No facility-administered encounter medications on file as of 07/04/2024.

## 2024-07-04 ENCOUNTER — Encounter: Payer: Self-pay | Admitting: Family Medicine

## 2024-07-04 ENCOUNTER — Ambulatory Visit (INDEPENDENT_AMBULATORY_CARE_PROVIDER_SITE_OTHER): Admitting: Family Medicine

## 2024-07-04 VITALS — BP 130/80 | HR 78 | Temp 98.2°F | Ht 68.0 in | Wt 274.5 lb

## 2024-07-04 DIAGNOSIS — M7502 Adhesive capsulitis of left shoulder: Secondary | ICD-10-CM

## 2024-07-04 DIAGNOSIS — G8929 Other chronic pain: Secondary | ICD-10-CM | POA: Diagnosis not present

## 2024-07-04 DIAGNOSIS — M25512 Pain in left shoulder: Secondary | ICD-10-CM

## 2024-07-19 ENCOUNTER — Ambulatory Visit: Admitting: Nurse Practitioner

## 2024-07-30 ENCOUNTER — Other Ambulatory Visit: Payer: Self-pay | Admitting: Family

## 2024-07-30 DIAGNOSIS — E1165 Type 2 diabetes mellitus with hyperglycemia: Secondary | ICD-10-CM

## 2024-08-02 ENCOUNTER — Ambulatory Visit: Admitting: Nurse Practitioner

## 2024-08-02 VITALS — BP 128/88 | HR 75 | Temp 98.0°F | Ht 68.0 in | Wt 277.4 lb

## 2024-08-02 DIAGNOSIS — Z6841 Body Mass Index (BMI) 40.0 and over, adult: Secondary | ICD-10-CM | POA: Diagnosis not present

## 2024-08-02 DIAGNOSIS — E1165 Type 2 diabetes mellitus with hyperglycemia: Secondary | ICD-10-CM

## 2024-08-02 LAB — POCT GLYCOSYLATED HEMOGLOBIN (HGB A1C): Hemoglobin A1C: 6.9 % — AB (ref 4.0–5.6)

## 2024-08-02 NOTE — Patient Instructions (Signed)
 Nice to see you today  Your A1C was 6.9% today We will keep your medications where they are Follow up with me in 3 months, sooner if you need me

## 2024-08-02 NOTE — Progress Notes (Signed)
 Established Patient Office Visit  Subjective   Patient ID: Emily Blevins, female    DOB: November 27, 1985  Age: 38 y.o. MRN: [id number]  Chief Complaint  Patient presents with   Diabetes    Pt requests to increase Trulicity  dosage due to sugar levels being out of whack     Discussed the use of AI scribe software for clinical note transcription with the patient, who gave verbal consent to proceed.  History of Present Illness Emily Blevins is a 38 year old female with diabetes who presents for follow-up on her blood sugar levels.  Her blood sugar levels have been fluctuating, reaching the 200s, which she attributes to stress eating due to household issues. She checks her sugar levels only when she feels unwell, rather than on a regular schedule. Her last A1c was 6.9; previously, it was 6.3.  She has been experiencing issues with her shoulder, diagnosed as frozen shoulder or capsulitis. This has impacted her ability to exercise, contributing to her stress and dietary habits. She has not been able to return to her previous workout routine, which included high-intensity interval training, due to concerns about exacerbating her shoulder pain.  She recalls a previous increase in her Trulicity  dosage that made her feel sick. Currently, she is not experiencing any fever, chills, chest pain, or shortness of breath. She reports regular bowel movements and urination.  She is concerned about her iron  intake, having learned that certain foods might interfere with iron  absorption, and is awaiting further guidance.     Review of Systems  Constitutional:  Negative for chills and fever.  Respiratory:  Negative for shortness of breath.   Cardiovascular:  Negative for chest pain.  Gastrointestinal:  Negative for abdominal pain, nausea and vomiting.  Neurological:  Negative for headaches.      Objective:     BP 128/88   Pulse 75   Temp 98 F (36.7 C) (Oral)   Ht 5' 8 (1.727 m)   Wt  277 lb 6.4 oz (125.8 kg)   LMP 06/30/2024   SpO2 98%   BMI 42.18 kg/m  BP Readings from Last 3 Encounters:  08/02/24 128/88  07/04/24 130/80  06/28/24 132/78   Wt Readings from Last 3 Encounters:  08/02/24 277 lb 6.4 oz (125.8 kg)  07/04/24 274 lb 8 oz (124.5 kg)  06/28/24 272 lb (123.4 kg)   SpO2 Readings from Last 3 Encounters:  08/02/24 98%  07/04/24 97%  06/28/24 99%      Physical Exam Vitals and nursing note reviewed.  Constitutional:      Appearance: Normal appearance.  Cardiovascular:     Rate and Rhythm: Normal rate and regular rhythm.     Heart sounds: Normal heart sounds.  Pulmonary:     Effort: Pulmonary effort is normal.     Breath sounds: Normal breath sounds.  Abdominal:     General: Bowel sounds are normal.  Neurological:     Mental Status: She is alert.      Results for orders placed or performed in visit on 08/02/24  POCT glycosylated hemoglobin (Hb A1C)  Result Value Ref Range   Hemoglobin A1C 6.9 (A) 4.0 - 5.6 %   HbA1c POC (<> result, manual entry)     HbA1c, POC (prediabetic range)     HbA1c, POC (controlled diabetic range)        The ASCVD Risk score (Arnett DK, et al., 2019) failed to calculate for the following reasons:   The 2019  ASCVD risk score is only valid for ages 69 to 76    Assessment & Plan:   Problem List Items Addressed This Visit       Endocrine   Type 2 diabetes mellitus with hyperglycemia, without long-term current use of insulin  (HCC) - Primary   Relevant Orders   POCT glycosylated hemoglobin (Hb A1C) (Completed)   Referral to Nutrition and Diabetes Services     Other   Morbid obesity (HCC)   Relevant Orders   Referral to Nutrition and Diabetes Services   Assessment and Plan Assessment & Plan Type 2 diabetes mellitus with hyperglycemia Blood glucose levels fluctuating due to decreased activity and stress eating. A1c increased from 6.3 to 6.9, still acceptable. Low blood sugar episodes prevent medication  increase. Current management appropriate for temporary setbacks. - Continue current diabetes management regimen. - Encouraged increased physical activity, focusing on lower body exercises such as walking, using a real bike, or elliptical. - Scheduled follow-up in 3 months to reassess A1c and blood glucose control. - Referred to dietitian for dietary guidance.  Shoulder capsulitis (frozen shoulder) Diagnosed by Dr. Ubaldo. Following exercises but lacks motivation and fears pain recurrence. Stress eating affects activity. - Encouraged continuation of prescribed exercises for shoulder rehabilitation. - Advised on alternative exercises that do not involve the shoulder, such as lower body exercises.  General Health Maintenance Blood pressure slightly elevated. No fever, chills, chest pain, or shortness of breath. Regular bowel movements and urination. - Rechecked blood pressure before leaving the clinic. - Declined flu vaccine today  Return in about 3 months (around 11/02/2024) for DM recheck.    Adina Crandall, NP

## 2024-08-07 ENCOUNTER — Telehealth: Payer: Self-pay

## 2024-08-07 DIAGNOSIS — E1165 Type 2 diabetes mellitus with hyperglycemia: Secondary | ICD-10-CM

## 2024-08-07 MED ORDER — TRULICITY 0.75 MG/0.5ML ~~LOC~~ SOAJ: SUBCUTANEOUS | 0 refills | Status: DC

## 2024-08-07 NOTE — Addendum Note (Signed)
 Addended by: WENDEE LYNWOOD HERO on: 08/07/2024 04:43 PM   Modules accepted: Orders

## 2024-08-07 NOTE — Telephone Encounter (Signed)
 Copied from CRM #[id number]. Topic: Clinical - Medication Question >> Aug 07, 2024  3:26 PM China J wrote: Reason for CRM: Patient is calling to see the status of her trulicity . Today is the last day of the promised turn around time and she is wanting to see if there is some kind of update.  Please call [phone]

## 2024-08-07 NOTE — Telephone Encounter (Signed)
 This was sent to wrong provider please advise

## 2024-08-16 ENCOUNTER — Other Ambulatory Visit: Payer: Self-pay | Admitting: Family

## 2024-08-16 DIAGNOSIS — D509 Iron deficiency anemia, unspecified: Secondary | ICD-10-CM

## 2024-08-27 NOTE — Telephone Encounter (Unsigned)
 Copied from CRM [id number]. Topic: Clinical - Prescription Issue >> Aug 27, 2024  1:45 PM Alfonso HERO wrote: Reason for CRM: patient calling to see why Ferrous Sulfate  325 mg Oral Daily refill was refused. Asking for a callback

## 2024-08-28 MED ORDER — FERROUS SULFATE 325 (65 FE) MG PO TABS: ORAL_TABLET | ORAL | 1 refills | Status: AC

## 2024-08-28 NOTE — Addendum Note (Signed)
 Addended by: WENDEE LYNWOOD HERO on: 08/28/2024 01:11 PM   Modules accepted: Orders

## 2024-08-28 NOTE — Progress Notes (Deleted)
° ° ° °  Emily Blevins T. Braelon Sprung, MD, CAQ Sports Medicine [facility] [address] [place], [address]  Phone: [phone]  FAX: [phone]  Emily Blevins - 38 y.o. female  MRN [id number]  Date of Birth: [birth year]  Date: 08/29/2024  PCP: Wendee Lynwood HERO, NP  Referral: Wendee Lynwood HERO, NP  No chief complaint on file.  Subjective:   LAROSA Blevins is a 38 y.o. very pleasant female patient with There is no height or weight on file to calculate BMI. who presents with the following:  Discussed the use of AI scribe software for clinical note transcription with the patient, who gave verbal consent to proceed.  Patient presents for 6-week follow-up on left shoulder with history of frozen shoulder.  Last time I saw her, I started her on [employer] range of motion protocol for frozen shoulder.  She also has a history of diabetes. History of Present Illness     Review of Systems is noted in the HPI, as appropriate  Objective:   There were no vitals taken for this visit.  GEN: No acute distress; alert,appropriate. PULM: Breathing comfortably in no respiratory distress PSYCH: Normally interactive.   Laboratory and Imaging Data:  Assessment and Plan:   No diagnosis found. Assessment & Plan   Medication Management during today's office visit: No orders of the defined types were placed in this encounter.  There are no discontinued medications.  Orders placed today for conditions managed today: No orders of the defined types were placed in this encounter.   Disposition: No follow-ups on file.  [employer] speech-to-text software was used for transcription in this dictation.  Possible transcriptional errors can occur using Animal nutritionist.   Signed,  Jacques DASEN. Bintou Lafata, MD   Outpatient Encounter Medications as of 08/29/2024  Medication Sig   Blood Glucose Monitoring Suppl (ONETOUCH VERIO FLEX SYSTEM) w/Device KIT CHECK  BLOOD SUGAR EVERY MORNING BEFORE BREAKFAST.   Dulaglutide  (TRULICITY ) 0.75 MG/0.5ML SOAJ INJECT 0.75 MG SUBCUTANEOUSLY ONE TIME PER WEEK   ferrous sulfate  325 (65 FE) MG tablet Take 1 tablet by mouth 3 times weekly (Monday, Wednesday, Friday)   glucose blood (ONETOUCH VERIO) test strip CHECK BLOOD SUGAR BEFORE BREAKFAST EVERY MORNING   metFORMIN  (GLUCOPHAGE ) 500 MG tablet TAKE 1 TABLET BY MOUTH TWICE A DAY   nystatin  (MYCOSTATIN /NYSTOP ) powder Apply 1 Application topically 3 (three) times daily.   ondansetron  (ZOFRAN -ODT) 4 MG disintegrating tablet Take 1 tablet (4 mg total) by mouth every 8 (eight) hours as needed.   sertraline  (ZOLOFT ) 100 MG tablet Take 1 tablet (100 mg total) by mouth daily.   No facility-administered encounter medications on file as of 08/29/2024.

## 2024-08-29 ENCOUNTER — Other Ambulatory Visit: Payer: Self-pay | Admitting: Nurse Practitioner

## 2024-08-29 ENCOUNTER — Ambulatory Visit: Admitting: Family Medicine

## 2024-08-29 ENCOUNTER — Other Ambulatory Visit (HOSPITAL_COMMUNITY): Payer: Self-pay

## 2024-08-29 ENCOUNTER — Telehealth: Payer: Self-pay

## 2024-08-29 DIAGNOSIS — E1165 Type 2 diabetes mellitus with hyperglycemia: Secondary | ICD-10-CM

## 2024-08-29 NOTE — Telephone Encounter (Signed)
 See PA request.

## 2024-08-29 NOTE — Telephone Encounter (Signed)
 Pharmacy Patient Advocate Encounter   Received notification from RX Request Messages that prior authorization for Trulicity  0.75 is required/requested.   Unable to verify insurance. The patient is insured through [facility] MEDICAID that expired 07/12/24.SABRA   Spoke with [facility] who says patient has not picked up medication since 07/02/24. Please advise

## 2024-08-30 ENCOUNTER — Other Ambulatory Visit (HOSPITAL_COMMUNITY): Payer: Self-pay

## 2024-09-03 ENCOUNTER — Ambulatory Visit: Payer: Self-pay

## 2024-09-03 NOTE — Telephone Encounter (Signed)
 FYI Only or Action Required?: FYI only for provider: appointment scheduled on 09/06/2024.  Patient was last seen in primary care on 08/02/2024 by Emily Lynwood HERO, NP.  Called Nurse Triage reporting Generalized Body Aches and Cough.  Symptoms began a week ago.  Interventions attempted: Rest, hydration, or home remedies.  Symptoms are: unchanged.  Triage Disposition: Home Care  Patient/caregiver understands and will follow disposition?: no, wishes to speak with PCP  Copied from CRM #[id number]. Topic: Clinical - Red Word Triage >> Sep 03, 2024 12:59 PM Deaijah H wrote: Red Word that prompted transfer to Nurse Triage: Real bad cough, gotten worse with pain. Reason for Disposition  Cough with cold symptoms (e.g., runny nose, postnasal drip, throat clearing)  Answer Assessment - Initial Assessment Questions 1. ONSET: When did the cough begin?      Five days ago 2. SEVERITY: How bad is the cough today?      moderate 3. SPUTUM: Describe the color of your sputum (e.g., none, dry cough; clear, white, yellow, green)     yellow 4. HEMOPTYSIS: Are you coughing up any blood? If Yes, ask: How much? (e.g., flecks, streaks, tablespoons, etc.)     denies 5. DIFFICULTY BREATHING: Are you having difficulty breathing? If Yes, ask: How bad is it? (e.g., mild, moderate, severe)      denies 6. FEVER: Do you have a fever? If Yes, ask: What is your temperature, how was it measured, and when did it start?     denies 7. CARDIAC HISTORY: Do you have any history of heart disease? (e.g., heart attack, congestive heart failure)      denies 8. LUNG HISTORY: Do you have any history of lung disease?  (e.g., pulmonary embolus, asthma, emphysema)     denies  10. OTHER SYMPTOMS: Do you have any other symptoms? (e.g., runny nose, wheezing, chest pain)       Runny nose, sinus pain, rib pain from cough  Protocols used: Cough - Acute Productive-A-AH

## 2024-09-04 ENCOUNTER — Telehealth: Payer: Self-pay

## 2024-09-04 NOTE — Telephone Encounter (Signed)
 noted

## 2024-09-04 NOTE — Telephone Encounter (Signed)
 Copied from CRM #[id number]. Topic: Clinical - Medical Advice >> Sep 04, 2024  4:32 PM Winona R wrote: Pt was triaged today for cough with cold symptoms, but calling back to inform the provider she has tested positive for FLU B and would like to know if there is anything else she should do besides let it take its course, pt would also like to know if she should cancel her sick visitor keep it

## 2024-09-05 ENCOUNTER — Telehealth: Payer: Self-pay | Admitting: *Deleted

## 2024-09-05 DIAGNOSIS — E1165 Type 2 diabetes mellitus with hyperglycemia: Secondary | ICD-10-CM

## 2024-09-05 MED ORDER — TRULICITY 0.75 MG/0.5ML ~~LOC~~ SOAJ: SUBCUTANEOUS | 1 refills | Status: AC

## 2024-09-05 NOTE — Telephone Encounter (Signed)
 Copied from CRM #[id number]. Topic: Clinical - Prescription Issue >> Sep 04, 2024  4:27 PM Winona R wrote: Pt states the Dulaglutide  (TRULICITY ) 0.75 MG/0.5ML SOAJ has not been filled but was requested by Pharmacy on 10/12.

## 2024-09-05 NOTE — Telephone Encounter (Signed)
 If she wants any medications she will need to keep the appointment. Given her history I would advise to keep it

## 2024-09-06 ENCOUNTER — Ambulatory Visit: Admitting: Family

## 2024-09-13 ENCOUNTER — Encounter: Admitting: Dietician

## 2024-09-24 ENCOUNTER — Other Ambulatory Visit (HOSPITAL_COMMUNITY): Payer: Self-pay

## 2024-10-25 ENCOUNTER — Telehealth: Payer: Self-pay

## 2024-10-25 NOTE — Telephone Encounter (Signed)
 Called and spoke with patient.  States she has enough until her appointment.  Mentions that she doesn't take the next dose until Wednesday or Thursday.

## 2024-10-25 NOTE — Telephone Encounter (Signed)
 We will discuss the dose increase then

## 2024-10-25 NOTE — Telephone Encounter (Signed)
 Can we find out if she has enough to get her to her appointment which Is this coming Thursday?

## 2024-10-25 NOTE — Telephone Encounter (Signed)
 Copied from CRM [id number]. Topic: Clinical - Medication Question >> Oct 25, 2024  9:45 AM Emily Blevins wrote: Reason for CRM: Patient stated that PCP prescribed Trulicity  1.5 mg in the past, but then lowered the dose to 0.75 mg due to patient not feeling well with the higher dose. Patient had leftover 1.5 mg injections and now she is tolerating it well and it is keeping her sugar where to needs to be and she is not feeling nauseous or vomiting. Patient questioning if she can have more of the Trulicity  1.5 mg called in.

## 2024-11-01 ENCOUNTER — Ambulatory Visit: Admitting: Nurse Practitioner
